# Patient Record
Sex: Male | Born: 1945 | Race: White | Hispanic: No | Marital: Married | Smoking: Never smoker
Health system: Southern US, Community
[De-identification: ages and names within clinical notes are randomized; demographics above are authoritative.]

## PROBLEM LIST (undated history)

## (undated) DIAGNOSIS — S6990XA Unspecified injury of unspecified wrist, hand and finger(s), initial encounter: Secondary | ICD-10-CM

## (undated) DIAGNOSIS — I10 Essential (primary) hypertension: Secondary | ICD-10-CM

## (undated) DIAGNOSIS — E785 Hyperlipidemia, unspecified: Secondary | ICD-10-CM

## (undated) DIAGNOSIS — H269 Unspecified cataract: Secondary | ICD-10-CM

## (undated) DIAGNOSIS — N189 Chronic kidney disease, unspecified: Secondary | ICD-10-CM

## (undated) DIAGNOSIS — E119 Type 2 diabetes mellitus without complications: Secondary | ICD-10-CM

## (undated) HISTORY — DX: Essential (primary) hypertension: I10

## (undated) HISTORY — PX: APPENDECTOMY: SHX54

## (undated) HISTORY — DX: Hyperlipidemia, unspecified: E78.5

## (undated) HISTORY — DX: Chronic kidney disease, unspecified: N18.9

## (undated) HISTORY — PX: CATARACT EXTRACTION: SUR2

## (undated) HISTORY — DX: Type 2 diabetes mellitus without complications: E11.9

## (undated) HISTORY — DX: Unspecified injury of unspecified wrist, hand and finger(s), initial encounter: S69.90XA

## (undated) HISTORY — DX: Unspecified cataract: H26.9

---

## 2003-02-02 ENCOUNTER — Encounter: Admission: RE | Admit: 2003-02-02 | Discharge: 2003-05-03 | Payer: Self-pay | Admitting: Family Medicine

## 2003-05-11 ENCOUNTER — Encounter: Admission: RE | Admit: 2003-05-11 | Discharge: 2003-08-09 | Payer: Self-pay | Admitting: Family Medicine

## 2004-09-11 LAB — HM COLONOSCOPY

## 2007-10-02 HISTORY — PX: COLONOSCOPY: SHX174

## 2008-01-19 LAB — HM COLONOSCOPY

## 2017-01-03 LAB — HM DIABETES EYE EXAM

## 2017-04-15 ENCOUNTER — Ambulatory Visit (INDEPENDENT_AMBULATORY_CARE_PROVIDER_SITE_OTHER): Payer: Medicare Other | Admitting: Adult Health

## 2017-04-15 ENCOUNTER — Encounter: Payer: Self-pay | Admitting: Adult Health

## 2017-04-15 VITALS — BP 150/88 | HR 85 | Ht 70.0 in | Wt 172.0 lb

## 2017-04-15 DIAGNOSIS — I152 Hypertension secondary to endocrine disorders: Secondary | ICD-10-CM | POA: Insufficient documentation

## 2017-04-15 DIAGNOSIS — Z0000 Encounter for general adult medical examination without abnormal findings: Secondary | ICD-10-CM | POA: Diagnosis not present

## 2017-04-15 DIAGNOSIS — R5383 Other fatigue: Secondary | ICD-10-CM | POA: Diagnosis not present

## 2017-04-15 DIAGNOSIS — I10 Essential (primary) hypertension: Secondary | ICD-10-CM | POA: Insufficient documentation

## 2017-04-15 MED ORDER — LISINOPRIL 10 MG PO TABS: 10.0000 mg | ORAL_TABLET | Freq: Every day | ORAL | 3 refills | Status: DC

## 2017-04-15 NOTE — Assessment & Plan Note (Signed)
Reports noticeable increase in fatigue in last 3 months. Vit d and TSH levels checked today.

## 2017-04-15 NOTE — Assessment & Plan Note (Signed)
Fasting labs obtained today. Continue excellent water intake and follow heart healthy diet. Bring BP log to next OV and if BP well controlled will sign off on surgical clearance.

## 2017-04-15 NOTE — Assessment & Plan Note (Signed)
BP at [facility]- 164/101, re-check 178/102 BP at OV today 147/87, re-check 150/82 Started on Lisinopril 10mg  daily Fasting labs checked today. Please check BP daily and bring log in at next OV. Please return in 3-4 weeks for BP check and clearance for surgery.

## 2017-04-15 NOTE — Progress Notes (Signed)
Subjective:    Patient ID: Jason Jason [name], Jason [name]    DOB: 04/17/1946, 71 y.o.   MRN: [id number]  HPI:  Jason Jason [name] presents to establish as a new pt.  He is a very pleasant 71 year old Jason [name].  PMH:  Successful cataract removal OS.  Aborted OD cataract removal due to elevated BP- SBPs 160-150, DBPs 100-110 He denies CP/palpiations/dyspnea/poor healing.  He hasn't has regular medical care in years, however he feel well overall.  His mother passed away this Feb 04, 2023 and his ex-wife June of this year.  He states that he is handling these loss's and has a strong support system of family and friends (he has one grown son that lives on the [place] and he visits often).  He denies tobacco use and reports social EOTH use.   Of Note: He requires medical clearance prior to OD cataract removal-he provided clearance form.  Patient Care Team    Relationship Specialty Notifications Start End  Esaw Grandchild, NP PCP - General Family Medicine  04/15/17     Patient Active Problem List   Diagnosis Date Noted  . HTN (hypertension) 04/15/2017  . Routine health maintenance 04/15/2017  . Fatigue 04/15/2017     Past Medical History:  Diagnosis Date  . Diabetes mellitus without complication ([place])   . Hypertension   . Wrist injury      Past Surgical History:  Procedure Laterality Date  . APPENDECTOMY    . CATARACT EXTRACTION       Family History  Problem Relation Age of Onset  . Stroke Mother   . Arthritis Father      History  Drug Use No     History  Alcohol Use  . 2.4 oz/week  . 4 Cans of beer per week     History  Smoking Status  . Never Smoker  Smokeless Tobacco  . Never Used     Outpatient Encounter Prescriptions as of 04/15/2017  Medication Sig  . lisinopril (PRINIVIL,ZESTRIL) 10 MG tablet Take 1 tablet (10 mg total) by mouth daily.   No facility-administered encounter medications on file as of 04/15/2017.     Allergies: Patient has no known allergies.  Body  mass index is 24.68 kg/m.  Blood pressure (!) 150/88, pulse 85, height 5\' 10"  (1.778 m), weight 172 lb (78 kg).    Review of Systems  Constitutional: Positive for fatigue. Negative for activity change, appetite change, chills, diaphoresis, fever and unexpected weight change.  HENT: Negative for congestion.   Eyes: Negative for visual disturbance.  Respiratory: Negative for cough, chest tightness, shortness of breath, wheezing and stridor.   Cardiovascular: Negative for chest pain, palpitations and leg swelling.  Gastrointestinal: Negative for abdominal distention, abdominal pain, blood in stool, constipation, diarrhea, nausea and vomiting.  Endocrine: Negative for cold intolerance, heat intolerance, polydipsia, polyphagia and polyuria.  Genitourinary: Negative for difficulty urinating, flank pain and hematuria.  Musculoskeletal: Negative for arthralgias, back pain, gait problem, joint swelling, myalgias and neck pain.  Skin: Negative for color change, pallor, rash and wound.  Allergic/Immunologic: Negative for immunocompromised state.  Neurological: Negative for dizziness and headaches.  Hematological: Does not bruise/bleed easily.  Psychiatric/Behavioral: Negative for dysphoric mood and sleep disturbance. The patient is not nervous/anxious.        Objective:   Physical Exam  Constitutional: He is oriented to person, place, and time. He appears well-developed and well-nourished. No distress.  HENT:  Head: Normocephalic and atraumatic.  Right Ear: External ear normal.  Left Ear: External ear normal.  Eyes: Pupils are equal, round, and reactive to light. Conjunctivae are normal.  Neck: Normal range of motion. Neck supple.  Cardiovascular: Normal rate, regular rhythm, normal heart sounds and intact distal pulses.   No murmur heard. Pulmonary/Chest: Effort normal and breath sounds normal. No respiratory distress. He has no wheezes. He has no rales. He exhibits no tenderness.   Musculoskeletal: Normal range of motion.  Lymphadenopathy:    He has no cervical adenopathy.  Neurological: He is alert and oriented to person, place, and time. Coordination normal.  Skin: Skin is warm and dry. No rash noted. He is not diaphoretic. No erythema. No pallor.  Psychiatric: He has a normal mood and affect. His behavior is normal. Judgment and thought content normal.  Nursing note and vitals reviewed.         Assessment & Plan:   1. Routine health maintenance   2. Essential hypertension   3. Fatigue, unspecified type     HTN (hypertension) BP at [facility]- 164/101, re-check 178/102 BP at OV today 147/87, re-check 150/82 Started on Lisinopril 10mg  daily Fasting labs checked today. Please check BP daily and bring log in at next OV. Please return in 3-4 weeks for BP check and clearance for surgery.   Routine health maintenance Fasting labs obtained today. Continue excellent water intake and follow heart healthy diet. Bring BP log to next OV and if BP well controlled will sign off on surgical clearance.   Fatigue Reports noticeable increase in fatigue in last 3 months. Vit d and TSH levels checked today.     FOLLOW-UP:  Return in about 3 weeks (around 05/06/2017) for Regular Follow Up, HTN.

## 2017-04-15 NOTE — Patient Instructions (Signed)
Hypertension Hypertension, commonly called high blood pressure, is when the force of blood pumping through the arteries is too strong. The arteries are the blood vessels that carry blood from the heart throughout the body. Hypertension forces the heart to work harder to pump blood and may cause arteries to become narrow or stiff. Having untreated or uncontrolled hypertension can cause heart attacks, strokes, kidney disease, and other problems. A blood pressure reading consists of a higher number over a lower number. Ideally, your blood pressure should be below 120/80. The first ("top") number is called the systolic pressure. It is a measure of the pressure in your arteries as your heart beats. The second ("bottom") number is called the diastolic pressure. It is a measure of the pressure in your arteries as the heart relaxes. What are the causes? The cause of this condition is not known. What increases the risk? Some risk factors for high blood pressure are under your control. Others are not. Factors you can change  Smoking.  Having type 2 diabetes mellitus, high cholesterol, or both.  Not getting enough exercise or physical activity.  Being overweight.  Having too much fat, sugar, calories, or salt (sodium) in your diet.  Drinking too much alcohol. Factors that are difficult or impossible to change  Having chronic kidney disease.  Having a family history of high blood pressure.  Age. Risk increases with age.  Race. You may be at higher risk if you are African-American.  Gender. Men are at higher risk than women before age 45. After age 65, women are at higher risk than men.  Having obstructive sleep apnea.  Stress. What are the signs or symptoms? Extremely high blood pressure (hypertensive crisis) may cause:  Headache.  Anxiety.  Shortness of breath.  Nosebleed.  Nausea and vomiting.  Severe chest pain.  Jerky movements you cannot control (seizures).  How is this  diagnosed? This condition is diagnosed by measuring your blood pressure while you are seated, with your arm resting on a surface. The cuff of the blood pressure monitor will be placed directly against the skin of your upper arm at the level of your heart. It should be measured at least twice using the same arm. Certain conditions can cause a difference in blood pressure between your right and left arms. Certain factors can cause blood pressure readings to be lower or higher than normal (elevated) for a short period of time:  When your blood pressure is higher when you are in a health care provider's office than when you are at home, this is called white coat hypertension. Most people with this condition do not need medicines.  When your blood pressure is higher at home than when you are in a health care provider's office, this is called masked hypertension. Most people with this condition may need medicines to control blood pressure.  If you have a high blood pressure reading during one visit or you have normal blood pressure with other risk factors:  You may be asked to return on a different day to have your blood pressure checked again.  You may be asked to monitor your blood pressure at home for 1 week or longer.  If you are diagnosed with hypertension, you may have other blood or imaging tests to help your health care provider understand your overall risk for other conditions. How is this treated? This condition is treated by making healthy lifestyle changes, such as eating healthy foods, exercising more, and reducing your alcohol intake. Your   health care provider may prescribe medicine if lifestyle changes are not enough to get your blood pressure under control, and if:  Your systolic blood pressure is above 130.  Your diastolic blood pressure is above 80.  Your personal target blood pressure may vary depending on your medical conditions, your age, and other factors. Follow these  instructions at home: Eating and drinking  Eat a diet that is high in fiber and potassium, and low in sodium, added sugar, and fat. An example eating plan is called the DASH (Dietary Approaches to Stop Hypertension) diet. To eat this way: ? Eat plenty of fresh fruits and vegetables. Try to fill half of your plate at each meal with fruits and vegetables. ? Eat whole grains, such as whole wheat pasta, brown rice, or whole grain bread. Fill about one quarter of your plate with whole grains. ? Eat or drink low-fat dairy products, such as skim milk or low-fat yogurt. ? Avoid fatty cuts of meat, processed or cured meats, and poultry with skin. Fill about one quarter of your plate with lean proteins, such as fish, chicken without skin, beans, eggs, and tofu. ? Avoid premade and processed foods. These tend to be higher in sodium, added sugar, and fat.  Reduce your daily sodium intake. Most people with hypertension should eat less than 1,500 mg of sodium a day.  Limit alcohol intake to no more than 1 drink a day for nonpregnant women and 2 drinks a day for men. One drink equals 12 oz of beer, 5 oz of wine, or 1 oz of hard liquor. Lifestyle  Work with your health care provider to maintain a healthy body weight or to lose weight. Ask what an ideal weight is for you.  Get at least 30 minutes of exercise that causes your heart to beat faster (aerobic exercise) most days of the week. Activities may include walking, swimming, or biking.  Include exercise to strengthen your muscles (resistance exercise), such as pilates or lifting weights, as part of your weekly exercise routine. Try to do these types of exercises for 30 minutes at least 3 days a week.  Do not use any products that contain nicotine or tobacco, such as cigarettes and e-cigarettes. If you need help quitting, ask your health care provider.  Monitor your blood pressure at home as told by your health care provider.  Keep all follow-up visits as  told by your health care provider. This is important. Medicines  Take over-the-counter and prescription medicines only as told by your health care provider. Follow directions carefully. Blood pressure medicines must be taken as prescribed.  Do not skip doses of blood pressure medicine. Doing this puts you at risk for problems and can make the medicine less effective.  Ask your health care provider about side effects or reactions to medicines that you should watch for. Contact a health care provider if:  You think you are having a reaction to a medicine you are taking.  You have headaches that keep coming back (recurring).  You feel dizzy.  You have swelling in your ankles.  You have trouble with your vision. Get help right away if:  You develop a severe headache or confusion.  You have unusual weakness or numbness.  You feel faint.  You have severe pain in your chest or abdomen.  You vomit repeatedly.  You have trouble breathing. Summary  Hypertension is when the force of blood pumping through your arteries is too strong. If this condition is not   controlled, it may put you at risk for serious complications.  Your personal target blood pressure may vary depending on your medical conditions, your age, and other factors. For most people, a normal blood pressure is less than 120/80.  Hypertension is treated with lifestyle changes, medicines, or a combination of both. Lifestyle changes include weight loss, eating a healthy, low-sodium diet, exercising more, and limiting alcohol. This information is not intended to replace advice given to you by your health care provider. Make sure you discuss any questions you have with your health care provider. Document Released: 09/17/2005 Document Revised: 08/15/2016 Document Reviewed: 08/15/2016 Elsevier Interactive Patient Education  2018 [employer].   Lisinopril tablets What is this medicine? LISINOPRIL (lyse [place] pril) is an ACE  inhibitor. This medicine is used to treat high blood pressure and heart failure. It is also used to protect the heart immediately after a heart attack. This medicine may be used for other purposes; ask your health care provider or pharmacist if you have questions. COMMON BRAND NAME(S): Prinivil, Zestril What should I tell my health care provider before I take this medicine? They need to know if you have any of these conditions: -diabetes -heart or blood vessel disease -kidney disease -low blood pressure -previous swelling of the tongue, face, or lips with difficulty breathing, difficulty swallowing, hoarseness, or tightening of the throat -an unusual or allergic reaction to lisinopril, other ACE inhibitors, insect venom, foods, dyes, or preservatives -pregnant or trying to get pregnant -breast-feeding How should I use this medicine? Take this medicine by mouth with a glass of water. Follow the directions on your prescription label. You may take this medicine with or without food. If it upsets your stomach, take it with food. Take your medicine at regular intervals. Do not take it more often than directed. Do not stop taking except on your doctor's advice. Talk to your pediatrician regarding the use of this medicine in children. Special care may be needed. While this drug may be prescribed for children as young as 68 years of age for selected conditions, precautions do apply. Overdosage: If you think you have taken too much of this medicine contact a poison control center or emergency room at once. NOTE: This medicine is only for you. Do not share this medicine with others. What if I miss a dose? If you miss a dose, take it as soon as you can. If it is almost time for your next dose, take only that dose. Do not take double or extra doses. What may interact with this medicine? Do not take this medicine with any of the following medications: -hymenoptera venom -sacubitril; valsartan This  medicines may also interact with the following medications: -aliskiren -angiotensin receptor blockers, like losartan or valsartan -certain medicines for diabetes -diuretics -everolimus -gold compounds -lithium -NSAIDs, medicines for pain and inflammation, like ibuprofen or naproxen -potassium salts or supplements -salt substitutes -sirolimus -temsirolimus This list may not describe all possible interactions. Give your health care provider a list of all the medicines, herbs, non-prescription drugs, or dietary supplements you use. Also tell them if you smoke, drink alcohol, or use illegal drugs. Some items may interact with your medicine. What should I watch for while using this medicine? Visit your doctor or health care professional for regular check ups. Check your blood pressure as directed. Ask your doctor what your blood pressure should be, and when you should contact him or her. Do not treat yourself for coughs, colds, or pain while you are  using this medicine without asking your doctor or health care professional for advice. Some ingredients may increase your blood pressure. Women should inform their doctor if they wish to become pregnant or think they might be pregnant. There is a potential for serious side effects to an unborn child. Talk to your health care professional or pharmacist for more information. Check with your doctor or health care professional if you get an attack of severe diarrhea, nausea and vomiting, or if you sweat a lot. The loss of too much body fluid can make it dangerous for you to take this medicine. You may get drowsy or dizzy. Do not drive, use machinery, or do anything that needs mental alertness until you know how this drug affects you. Do not stand or sit up quickly, especially if you are an older patient. This reduces the risk of dizzy or fainting spells. Alcohol can make you more drowsy and dizzy. Avoid alcoholic drinks. Avoid salt substitutes unless you are  told otherwise by your doctor or health care professional. What side effects may I notice from receiving this medicine? Side effects that you should report to your doctor or health care professional as soon as possible: -allergic reactions like skin rash, itching or hives, swelling of the hands, feet, face, lips, throat, or tongue -breathing problems -signs and symptoms of kidney injury like trouble passing urine or change in the amount of urine -signs and symptoms of increased potassium like muscle weakness; chest pain; or fast, irregular heartbeat -signs and symptoms of liver injury like dark yellow or brown urine; general ill feeling or flu-like symptoms; light-colored stools; loss of appetite; nausea; right upper belly pain; unusually weak or tired; yellowing of the eyes or skin -signs and symptoms of low blood pressure like dizziness; feeling faint or lightheaded, falls; unusually weak or tired -stomach pain with or without nausea and vomiting Side effects that usually do not require medical attention (report to your doctor or health care professional if they continue or are bothersome): -changes in taste -cough -dizziness -fever -headache -sensitivity to light This list may not describe all possible side effects. Call your doctor for medical advice about side effects. You may report side effects to FDA at 1-800-FDA-1088. Where should I keep my medicine? Keep out of the reach of children. Store at room temperature between 15 and 30 degrees C (59 and 86 degrees F). Protect from moisture. Keep container tightly closed. Throw away any unused medicine after the expiration date. NOTE: This sheet is a summary. It may not cover all possible information. If you have questions about this medicine, talk to your doctor, pharmacist, or health care provider.  2018 Elsevier/Gold Standard (2015-11-07 12:52:35)  Heart-Healthy Eating Plan Many factors influence your heart health, including eating and  exercise habits. Heart (coronary) risk increases with abnormal blood fat (lipid) levels. Heart-healthy meal planning includes limiting unhealthy fats, increasing healthy fats, and making other small dietary changes. This includes maintaining a healthy body weight to help keep lipid levels within a normal range. What is my plan? Your health care provider recommends that you:  Get no more than _________% of the total calories in your daily diet from fat.  Limit your intake of saturated fat to less than _________% of your total calories each day.  Limit the amount of cholesterol in your diet to less than _________ mg per day.  What types of fat should I choose?  Choose healthy fats more often. Choose monounsaturated and polyunsaturated fats, such as olive  oil and canola oil, flaxseeds, walnuts, almonds, and seeds.  Eat more omega-3 fats. Good choices include salmon, mackerel, sardines, tuna, flaxseed oil, and ground flaxseeds. Aim to eat fish at least two times each week.  Limit saturated fats. Saturated fats are primarily found in animal products, such as meats, butter, and cream. Plant sources of saturated fats include palm oil, palm kernel oil, and coconut oil.  Avoid foods with partially hydrogenated oils in them. These contain trans fats. Examples of foods that contain trans fats are stick margarine, some tub margarines, cookies, crackers, and other baked goods. What general guidelines do I need to follow?  Check food labels carefully to identify foods with trans fats or high amounts of saturated fat.  Fill one half of your plate with vegetables and green salads. Eat 4-5 servings of vegetables per day. A serving of vegetables equals 1 cup of raw leafy vegetables,  cup of raw or cooked cut-up vegetables, or  cup of vegetable juice.  Fill one fourth of your plate with whole grains. Look for the word "whole" as the first word in the ingredient list.  Fill one fourth of your plate with  lean protein foods.  Eat 4-5 servings of fruit per day. A serving of fruit equals one medium whole fruit,  cup of dried fruit,  cup of fresh, frozen, or canned fruit, or  cup of 100% fruit juice.  Eat more foods that contain soluble fiber. Examples of foods that contain this type of fiber are apples, broccoli, carrots, beans, peas, and barley. Aim to get 20-30 g of fiber per day.  Eat more home-cooked food and less restaurant, buffet, and fast food.  Limit or avoid alcohol.  Limit foods that are high in starch and sugar.  Avoid fried foods.  Cook foods by using methods other than frying. Baking, boiling, grilling, and broiling are all great options. Other fat-reducing suggestions include: ? Removing the skin from poultry. ? Removing all visible fats from meats. ? Skimming the fat off of stews, soups, and gravies before serving them. ? Steaming vegetables in water or broth.  Lose weight if you are overweight. Losing just 5-10% of your initial body weight can help your overall health and prevent diseases such as diabetes and heart disease.  Increase your consumption of nuts, legumes, and seeds to 4-5 servings per week. One serving of dried beans or legumes equals  cup after being cooked, one serving of nuts equals 1 ounces, and one serving of seeds equals  ounce or 1 tablespoon.  You may need to monitor your salt (sodium) intake, especially if you have high blood pressure. Talk with your health care provider or dietitian to get more information about reducing sodium. What foods can I eat? Grains  Breads, including [place], white, pita, wheat, raisin, rye, oatmeal, and [place]. Tortillas that are neither fried nor made with lard or trans fat. Low-fat rolls, including hotdog and hamburger buns and English muffins. Biscuits. Muffins. Waffles. Pancakes. Light popcorn. Whole-grain cereals. Flatbread. Melba toast. Pretzels. Breadsticks. Rusks. Low-fat snacks and crackers, including oyster,  saltine, matzo, graham, animal, and rye. Rice and pasta, including brown rice and those that are made with whole wheat. Vegetables All vegetables. Fruits All fruits, but limit coconut. Meats and Other Protein Sources Lean, well-trimmed beef, veal, pork, and lamb. Chicken and [place] without skin. All fish and shellfish. Wild duck, rabbit, pheasant, and venison. Egg whites or low-cholesterol egg substitutes. Dried beans, peas, lentils, and tofu.Seeds and most nuts. Dairy  Low-fat or nonfat cheeses, including ricotta, string, and mozzarella. Skim or 1% milk that is liquid, powdered, or evaporated. Buttermilk that is made with low-fat milk. Nonfat or low-fat yogurt. Beverages Mineral water. Diet carbonated beverages. Sweets and Desserts Sherbets and fruit ices. Honey, jam, marmalade, jelly, and syrups. Meringues and gelatins. Pure sugar candy, such as hard candy, jelly beans, gumdrops, mints, marshmallows, and small amounts of dark chocolate. [employer] Eat all sweets and desserts in moderation. Fats and Oils Nonhydrogenated (trans-free) margarines. Vegetable oils, including soybean, sesame, sunflower, olive, peanut, safflower, corn, canola, and cottonseed. Salad dressings or mayonnaise that are made with a vegetable oil. Limit added fats and oils that you use for cooking, baking, salads, and as spreads. Other Cocoa powder. Coffee and tea. All seasonings and condiments. The items listed above may not be a complete list of recommended foods or beverages. Contact your dietitian for more options. What foods are not recommended? Grains Breads that are made with saturated or trans fats, oils, or whole milk. Croissants. Butter rolls. Cheese breads. Sweet rolls. Donuts. Buttered popcorn. Chow mein noodles. High-fat crackers, such as cheese or butter crackers. Meats and Other Protein Sources Fatty meats, such as hotdogs, short ribs, sausage, spareribs, bacon, ribeye roast or steak, and mutton.  High-fat deli meats, such as salami and bologna. Caviar. Domestic duck and goose. Organ meats, such as kidney, liver, sweetbreads, brains, gizzard, chitterlings, and heart. Dairy Cream, sour cream, cream cheese, and creamed cottage cheese. Whole milk cheeses, including blue (bleu), Monte[place] Jack, [place], [place], [place], [place], [place], [place], [place], and [place]. Whole or 2% milk that is liquid, evaporated, or condensed. Whole buttermilk. Cream sauce or high-fat cheese sauce. Yogurt that is made from whole milk. Beverages Regular sodas and drinks with added sugar. Sweets and Desserts Frosting. Pudding. Cookies. Cakes other than angel food cake. Candy that has milk chocolate or white chocolate, hydrogenated fat, butter, coconut, or unknown ingredients. Buttered syrups. Full-fat ice cream or ice cream drinks. Fats and Oils Gravy that has suet, meat fat, or shortening. Cocoa butter, hydrogenated oils, palm oil, coconut oil, palm kernel oil. These can often be found in baked products, candy, fried foods, nondairy creamers, and whipped toppings. Solid fats and shortenings, including bacon fat, salt pork, lard, and butter. Nondairy cream substitutes, such as coffee creamers and sour cream substitutes. Salad dressings that are made of unknown oils, cheese, or sour cream. The items listed above may not be a complete list of foods and beverages to avoid. Contact your dietitian for more information. This information is not intended to replace advice given to you by your health care provider. Make sure you discuss any questions you have with your health care provider. Document Released: 06/26/2008 Document Revised: 04/06/2016 Document Reviewed: 03/11/2014 Elsevier Interactive Patient Education  2017 [place].   Please check BP as often as you can and bring log with to next office visit in 3-4 weeks. Continue excellent water intake and follow heart healthy diet. We will call when lab results are  available. WELCOME TO THE PRACTICE!

## 2017-04-17 LAB — CBC WITH DIFFERENTIAL/PLATELET
BASOS ABS: 0 10*3/uL (ref 0.0–0.2)
Basos: 0 %
EOS (ABSOLUTE): 0.1 10*3/uL (ref 0.0–0.4)
Eos: 2 %
HEMATOCRIT: 47.5 % (ref 37.5–51.0)
HEMOGLOBIN: 16.7 g/dL (ref 13.0–17.7)
IMMATURE GRANULOCYTES: 0 %
Immature Grans (Abs): 0 10*3/uL (ref 0.0–0.1)
LYMPHS ABS: 1.7 10*3/uL (ref 0.7–3.1)
Lymphs: 21 %
MCH: 36.1 pg — ABNORMAL HIGH (ref 26.6–33.0)
MCHC: 35.2 g/dL (ref 31.5–35.7)
MCV: 103 fL — ABNORMAL HIGH (ref 79–97)
MONOCYTES: 9 %
Monocytes Absolute: 0.7 10*3/uL (ref 0.1–0.9)
NEUTROS PCT: 68 %
Neutrophils Absolute: 5.4 10*3/uL (ref 1.4–7.0)
Platelets: 216 10*3/uL (ref 150–379)
RBC: 4.63 x10E6/uL (ref 4.14–5.80)
RDW: 12.8 % (ref 12.3–15.4)
WBC: 7.9 10*3/uL (ref 3.4–10.8)

## 2017-04-17 LAB — COMPREHENSIVE METABOLIC PANEL
ALK PHOS: 58 IU/L (ref 39–117)
ALT: 46 IU/L — AB (ref 0–44)
AST: 37 IU/L (ref 0–40)
Albumin/Globulin Ratio: 1.7 (ref 1.2–2.2)
Albumin: 4.3 g/dL (ref 3.6–4.8)
BILIRUBIN TOTAL: 0.9 mg/dL (ref 0.0–1.2)
BUN/Creatinine Ratio: 15 (ref 10–24)
BUN: 11 mg/dL (ref 8–27)
CHLORIDE: 100 mmol/L (ref 96–106)
CO2: 21 mmol/L (ref 20–29)
CREATININE: 0.75 mg/dL — AB (ref 0.76–1.27)
Calcium: 9.9 mg/dL (ref 8.6–10.2)
GFR calc Af Amer: 109 mL/min/{1.73_m2} (ref >59)
GFR calc non Af Amer: 94 mL/min/{1.73_m2} (ref >59)
GLUCOSE: 164 mg/dL — AB (ref 65–99)
Globulin, Total: 2.5 g/dL (ref 1.5–4.5)
Potassium: 4.9 mmol/L (ref 3.5–5.2)
Sodium: 140 mmol/L (ref 134–144)
Total Protein: 6.8 g/dL (ref 6.0–8.5)

## 2017-04-17 LAB — HEMOGLOBIN A1C
Est. average glucose Bld gHb Est-mCnc: 160 mg/dL
Hgb A1c MFr Bld: 7.2 % — ABNORMAL HIGH (ref 4.8–5.6)

## 2017-04-17 LAB — LIPID PANEL
CHOLESTEROL TOTAL: 209 mg/dL — AB (ref 100–199)
Chol/HDL Ratio: 4.6 ratio (ref 0.0–5.0)
HDL: 45 mg/dL (ref >39)
LDL CALC: 145 mg/dL — AB (ref 0–99)
TRIGLYCERIDES: 95 mg/dL (ref 0–149)
VLDL Cholesterol Cal: 19 mg/dL (ref 5–40)

## 2017-04-17 LAB — VITAMIN D 25 HYDROXY (VIT D DEFICIENCY, FRACTURES): Vit D, 25-Hydroxy: 40.6 ng/mL (ref 30.0–100.0)

## 2017-04-17 LAB — VITAMIN B12: VITAMIN B 12: 986 pg/mL (ref 232–1245)

## 2017-05-16 ENCOUNTER — Encounter: Payer: Self-pay | Admitting: Adult Health

## 2017-05-16 ENCOUNTER — Ambulatory Visit (INDEPENDENT_AMBULATORY_CARE_PROVIDER_SITE_OTHER): Payer: Medicare Other | Admitting: Adult Health

## 2017-05-16 VITALS — BP 179/92 | HR 83 | Ht 70.0 in | Wt 173.4 lb

## 2017-05-16 DIAGNOSIS — E1169 Type 2 diabetes mellitus with other specified complication: Secondary | ICD-10-CM | POA: Diagnosis not present

## 2017-05-16 DIAGNOSIS — I10 Essential (primary) hypertension: Secondary | ICD-10-CM | POA: Diagnosis not present

## 2017-05-16 DIAGNOSIS — E119 Type 2 diabetes mellitus without complications: Secondary | ICD-10-CM | POA: Insufficient documentation

## 2017-05-16 DIAGNOSIS — E785 Hyperlipidemia, unspecified: Secondary | ICD-10-CM | POA: Diagnosis not present

## 2017-05-16 DIAGNOSIS — Z79899 Other long term (current) drug therapy: Secondary | ICD-10-CM | POA: Diagnosis not present

## 2017-05-16 DIAGNOSIS — E118 Type 2 diabetes mellitus with unspecified complications: Secondary | ICD-10-CM | POA: Diagnosis not present

## 2017-05-16 MED ORDER — GLUCOSE BLOOD VI STRP: ORAL_STRIP | 12 refills | Status: DC

## 2017-05-16 MED ORDER — BLOOD GLUCOSE METER KIT: PACK | 0 refills | Status: AC

## 2017-05-16 MED ORDER — ATORVASTATIN CALCIUM 10 MG PO TABS: 10.0000 mg | ORAL_TABLET | Freq: Every day | ORAL | 3 refills | Status: DC

## 2017-05-16 MED ORDER — METFORMIN HCL 500 MG PO TABS: 500.0000 mg | ORAL_TABLET | Freq: Two times a day (BID) | ORAL | 3 refills | Status: DC

## 2017-05-16 MED ORDER — LISINOPRIL-HYDROCHLOROTHIAZIDE 10-12.5 MG PO TABS: 1.0000 | ORAL_TABLET | Freq: Every day | ORAL | 3 refills | Status: DC

## 2017-05-16 NOTE — Assessment & Plan Note (Signed)
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 Reduce CHO/sugar and abstain from [facility] Continue daily walking

## 2017-05-16 NOTE — Patient Instructions (Addendum)
Carbohydrate Counting for Diabetes Mellitus, Adult Carbohydrate counting is a method for keeping track of how many carbohydrates you eat. Eating carbohydrates naturally increases the amount of sugar (glucose) in the blood. Counting how many carbohydrates you eat helps keep your blood glucose within normal limits, which helps you manage your diabetes (diabetes mellitus). It is important to know how many carbohydrates you can safely have in each meal. This is different for every person. A diet and nutrition specialist (registered dietitian) can help you make a meal plan and calculate how many carbohydrates you should have at each meal and snack. Carbohydrates are found in the following foods:  Grains, such as breads and cereals.  Dried beans and soy products.  Starchy vegetables, such as potatoes, peas, and corn.  Fruit and fruit juices.  Milk and yogurt.  Sweets and snack foods, such as cake, cookies, candy, chips, and soft drinks.  How do I count carbohydrates? There are two ways to count carbohydrates in food. You can use either of the methods or a combination of both. Reading "Nutrition Facts" on packaged food The "Nutrition Facts" list is included on the labels of almost all packaged foods and beverages in the [place] It includes:  The serving size.  Information about nutrients in each serving, including the grams (g) of carbohydrate per serving.  To use the "Nutrition Facts":  Decide how many servings you will have.  Multiply the number of servings by the number of carbohydrates per serving.  The resulting number is the total amount of carbohydrates that you will be having.  Learning standard serving sizes of other foods When you eat foods containing carbohydrates that are not packaged or do not include "Nutrition Facts" on the label, you need to measure the servings in order to count the amount of carbohydrates:  Measure the foods that you will eat with a food scale or  measuring cup, if needed.  Decide how many standard-size servings you will eat.  Multiply the number of servings by 15. Most carbohydrate-rich foods have about 15 g of carbohydrates per serving. ? For example, if you eat 8 oz (170 g) of strawberries, you will have eaten 2 servings and 30 g of carbohydrates (2 servings x 15 g = 30 g).  For foods that have more than one food mixed, such as soups and casseroles, you must count the carbohydrates in each food that is included.  The following list contains standard serving sizes of common carbohydrate-rich foods. Each of these servings has about 15 g of carbohydrates:   hamburger bun or  English muffin.   oz (15 mL) syrup.   oz (14 g) jelly.  1 slice of bread.  1 six-inch tortilla.  3 oz (85 g) cooked rice or pasta.  4 oz (113 g) cooked dried beans.  4 oz (113 g) starchy vegetable, such as peas, corn, or potatoes.  4 oz (113 g) hot cereal.  4 oz (113 g) mashed potatoes or  of a large baked potato.  4 oz (113 g) canned or frozen fruit.  4 oz (120 mL) fruit juice.  4-6 crackers.  6 chicken nuggets.  6 oz (170 g) unsweetened dry cereal.  6 oz (170 g) plain fat-free yogurt or yogurt sweetened with artificial sweeteners.  8 oz (240 mL) milk.  8 oz (170 g) fresh fruit or one small piece of fruit.  24 oz (680 g) popped popcorn.  Example of carbohydrate counting Sample meal  3 oz (85 g) chicken breast.    6 oz (170 g) brown rice.  4 oz (113 g) corn.  8 oz (240 mL) milk.  8 oz (170 g) strawberries with sugar-free whipped topping. Carbohydrate calculation 1. Identify the foods that contain carbohydrates: ? Rice. ? Corn. ? Milk. ? Strawberries. 2. Calculate how many servings you have of each food: ? 2 servings rice. ? 1 serving corn. ? 1 serving milk. ? 1 serving strawberries. 3. Multiply each number of servings by 15 g: ? 2 servings rice x 15 g = 30 g. ? 1 serving corn x 15 g = 15 g. ? 1 serving milk x 15  g = 15 g. ? 1 serving strawberries x 15 g = 15 g. 4. Add together all of the amounts to find the total grams of carbohydrates eaten: ? 30 g + 15 g + 15 g + 15 g = 75 g of carbohydrates total. This information is not intended to replace advice given to you by your health care provider. Make sure you discuss any questions you have with your health care provider. Document Released: 09/17/2005 Document Revised: 04/06/2016 Document Reviewed: 02/29/2016 Elsevier Interactive Patient Education  2018 [employer]. Blood Glucose Monitoring, Adult Monitoring your blood sugar (glucose) helps you manage your diabetes. It also helps you and your health care provider determine how well your diabetes management plan is working. Blood glucose monitoring involves checking your blood glucose as often as directed, and keeping a record (log) of your results over time. Why should I monitor my blood glucose? Checking your blood glucose regularly can:  Help you understand how food, exercise, illnesses, and medicines affect your blood glucose.  Let you know what your blood glucose is at any time. You can quickly tell if you are having low blood glucose (hypoglycemia) or high blood glucose (hyperglycemia).  Help you and your health care provider adjust your medicines as needed.  When should I check my blood glucose? Follow instructions from your health care provider about how often to check your blood glucose. This may depend on:  The type of diabetes you have.  How well-controlled your diabetes is.  Medicines you are taking.  If you have type 1 diabetes:  Check your blood glucose at least 2 times a day.  Also check your blood glucose: ? Before every insulin injection. ? Before and after exercise. ? Between meals. ? 2 hours after a meal. ? Occasionally between 2:00 a.m. and 3:00 a.m., as directed. ? Before potentially dangerous tasks, like driving or using heavy machinery. ? At bedtime.  You may need  to check your blood glucose more often, up to 6-10 times a day: ? If you use an insulin pump. ? If you need multiple daily injections (MDI). ? If your diabetes is not well-controlled. ? If you are ill. ? If you have a history of severe hypoglycemia. ? If you have a history of not knowing when your blood glucose is getting low (hypoglycemia unawareness). If you have type 2 diabetes:  If you take insulin or other diabetes medicines, check your blood glucose at least 2 times a day.  If you are on intensive insulin therapy, check your blood glucose at least 4 times a day. Occasionally, you may also need to check between 2:00 a.m. and 3:00 a.m., as directed.  Also check your blood glucose: ? Before and after exercise. ? Before potentially dangerous tasks, like driving or using heavy machinery.  You may need to check your blood glucose more often if: ?  Your medicine is being adjusted. ? Your diabetes is not well-controlled. ? You are ill. What is a blood glucose log?  A blood glucose log is a record of your blood glucose readings. It helps you and your health care provider: ? Look for patterns in your blood glucose over time. ? Adjust your diabetes management plan as needed.  Every time you check your blood glucose, write down your result and notes about things that may be affecting your blood glucose, such as your diet and exercise for the day.  Most glucose meters store a record of glucose readings in the meter. Some meters allow you to download your records to a computer. How do I check my blood glucose? Follow these steps to get accurate readings of your blood glucose: Supplies needed   Blood glucose meter.  Test strips for your meter. Each meter has its own strips. You must use the strips that come with your meter.  A needle to prick your finger (lancet). Do not use lancets more than once.  A device that holds the lancet (lancing device).  A journal or log book to write down  your results. Procedure  Wash your hands with soap and water.  Prick the side of your finger (not the tip) with the lancet. Use a different finger each time.  Gently rub the finger until a small drop of blood appears.  Follow instructions that come with your meter for inserting the test strip, applying blood to the strip, and using your blood glucose meter.  Write down your result and any notes. Alternative testing sites  Some meters allow you to use areas of your body other than your finger (alternative sites) to test your blood.  If you think you may have hypoglycemia, or if you have hypoglycemia unawareness, do not use alternative sites. Use your finger instead.  Alternative sites may not be as accurate as the fingers, because blood flow is slower in these areas. This means that the result you get may be delayed, and it may be different from the result that you would get from your finger.  The most common alternative sites are: ? Forearm. ? Thigh. ? Palm of the hand. Additional tips  Always keep your supplies with you.  If you have questions or need help, all blood glucose meters have a 24-hour "hotline" number that you can call. You may also contact your health care provider.  After you use a few boxes of test strips, adjust (calibrate) your blood glucose meter by following instructions that came with your meter. This information is not intended to replace advice given to you by your health care provider. Make sure you discuss any questions you have with your health care provider. Document Released: 09/20/2003 Document Revised: 04/06/2016 Document Reviewed: 02/27/2016 Elsevier Interactive Patient Education  2017 [employer]   Hypertension Hypertension, commonly called high blood pressure, is when the force of blood pumping through the arteries is too strong. The arteries are the blood vessels that carry blood from the heart throughout the body. Hypertension forces the  heart to work harder to pump blood and may cause arteries to become narrow or stiff. Having untreated or uncontrolled hypertension can cause heart attacks, strokes, kidney disease, and other problems. A blood pressure reading consists of a higher number over a lower number. Ideally, your blood pressure should be below 120/80. The first ("top") number is called the systolic pressure. It is a measure of the pressure in your arteries as your  heart beats. The second ("bottom") number is called the diastolic pressure. It is a measure of the pressure in your arteries as the heart relaxes. What are the causes? The cause of this condition is not known. What increases the risk? Some risk factors for high blood pressure are under your control. Others are not. Factors you can change  Smoking.  Having type 2 diabetes mellitus, high cholesterol, or both.  Not getting enough exercise or physical activity.  Being overweight.  Having too much fat, sugar, calories, or salt (sodium) in your diet.  Drinking too much alcohol. Factors that are difficult or impossible to change  Having chronic kidney disease.  Having a family history of high blood pressure.  Age. Risk increases with age.  Race. You may be at higher risk if you are African-American.  Gender. Men are at higher risk than women before age 58. After age 45, women are at higher risk than men.  Having obstructive sleep apnea.  Stress. What are the signs or symptoms? Extremely high blood pressure (hypertensive crisis) may cause:  Headache.  Anxiety.  Shortness of breath.  Nosebleed.  Nausea and vomiting.  Severe chest pain.  Jerky movements you cannot control (seizures).  How is this diagnosed? This condition is diagnosed by measuring your blood pressure while you are seated, with your arm resting on a surface. The cuff of the blood pressure monitor will be placed directly against the skin of your upper arm at the level of your  heart. It should be measured at least twice using the same arm. Certain conditions can cause a difference in blood pressure between your right and left arms. Certain factors can cause blood pressure readings to be lower or higher than normal (elevated) for a short period of time:  When your blood pressure is higher when you are in a health care provider's office than when you are at home, this is called white coat hypertension. Most people with this condition do not need medicines.  When your blood pressure is higher at home than when you are in a health care provider's office, this is called masked hypertension. Most people with this condition may need medicines to control blood pressure.  If you have a high blood pressure reading during one visit or you have normal blood pressure with other risk factors:  You may be asked to return on a different day to have your blood pressure checked again.  You may be asked to monitor your blood pressure at home for 1 week or longer.  If you are diagnosed with hypertension, you may have other blood or imaging tests to help your health care provider understand your overall risk for other conditions. How is this treated? This condition is treated by making healthy lifestyle changes, such as eating healthy foods, exercising more, and reducing your alcohol intake. Your health care provider may prescribe medicine if lifestyle changes are not enough to get your blood pressure under control, and if:  Your systolic blood pressure is above 130.  Your diastolic blood pressure is above 80.  Your personal target blood pressure may vary depending on your medical conditions, your age, and other factors. Follow these instructions at home: Eating and drinking  Eat a diet that is high in fiber and potassium, and low in sodium, added sugar, and fat. An example eating plan is called the DASH (Dietary Approaches to Stop Hypertension) diet. To eat this way: ? Eat plenty of  fresh fruits and vegetables. Try to  fill half of your plate at each meal with fruits and vegetables. ? Eat whole grains, such as whole wheat pasta, brown rice, or whole grain bread. Fill about one quarter of your plate with whole grains. ? Eat or drink low-fat dairy products, such as skim milk or low-fat yogurt. ? Avoid fatty cuts of meat, processed or cured meats, and poultry with skin. Fill about one quarter of your plate with lean proteins, such as fish, chicken without skin, beans, eggs, and tofu. ? Avoid premade and processed foods. These tend to be higher in sodium, added sugar, and fat.  Reduce your daily sodium intake. Most people with hypertension should eat less than 1,500 mg of sodium a day.  Limit alcohol intake to no more than 1 drink a day for nonpregnant women and 2 drinks a day for men. One drink equals 12 oz of beer, 5 oz of wine, or 1 oz of hard liquor. Lifestyle  Work with your health care provider to maintain a healthy body weight or to lose weight. Ask what an ideal weight is for you.  Get at least 30 minutes of exercise that causes your heart to beat faster (aerobic exercise) most days of the week. Activities may include walking, swimming, or biking.  Include exercise to strengthen your muscles (resistance exercise), such as pilates or lifting weights, as part of your weekly exercise routine. Try to do these types of exercises for 30 minutes at least 3 days a week.  Do not use any products that contain nicotine or tobacco, such as cigarettes and e-cigarettes. If you need help quitting, ask your health care provider.  Monitor your blood pressure at home as told by your health care provider.  Keep all follow-up visits as told by your health care provider. This is important. Medicines  Take over-the-counter and prescription medicines only as told by your health care provider. Follow directions carefully. Blood pressure medicines must be taken as prescribed.  Do not skip  doses of blood pressure medicine. Doing this puts you at risk for problems and can make the medicine less effective.  Ask your health care provider about side effects or reactions to medicines that you should watch for. Contact a health care provider if:  You think you are having a reaction to a medicine you are taking.  You have headaches that keep coming back (recurring).  You feel dizzy.  You have swelling in your ankles.  You have trouble with your vision. Get help right away if:  You develop a severe headache or confusion.  You have unusual weakness or numbness.  You feel faint.  You have severe pain in your chest or abdomen.  You vomit repeatedly.  You have trouble breathing. Summary  Hypertension is when the force of blood pumping through your arteries is too strong. If this condition is not controlled, it may put you at risk for serious complications.  Your personal target blood pressure may vary depending on your medical conditions, your age, and other factors. For most people, a normal blood pressure is less than 120/80.  Hypertension is treated with lifestyle changes, medicines, or a combination of both. Lifestyle changes include weight loss, eating a healthy, low-sodium diet, exercising more, and limiting alcohol. This information is not intended to replace advice given to you by your health care provider. Make sure you discuss any questions you have with your health care provider. Document Released: 09/17/2005 Document Revised: 08/15/2016 Document Reviewed: 08/15/2016 Elsevier Interactive Patient Education  2018  [employer].  Please take new BP medication daily Please take Atorvastatin 10mg  nightly at 6pm Please take Metformin 500mg  nightly with dinner for the first two weeks, then increase to one tab with breakfast and one tab with dinner. Please call insurance and ask which glucometer your insurance will cover and then call us so we can send in the correct  order. Please make follow-up in 4 weeks. Please bring log of blood pressure and blood sugar at next office appt.

## 2017-05-16 NOTE — Addendum Note (Signed)
Addended by: Fonnie Mu on: 05/16/2017 02:22 PM   Modules accepted: Orders

## 2017-05-16 NOTE — Progress Notes (Signed)
Subjective:    Patient ID: Jason [name], male    DOB: Oct 14, 1945, 71 y.o.   MRN: [id number]  HPI:  Mr. Digilio presents for f/u:  HTN.  He was scheduled for cataract surgery, however sig elevated BP aborted procedure. He established care with our practice on 04/15/17- started on Lisinopril 10mg .  He reports taking medication daily and avoiding Na+.  He denies tobacco use. He reports drinking EOTH and is a bit vague about type/amount.  He estimates to drink 2-3 beers or liquor sat/sun and says "I drink a few during the week too".  He denies [place] abuse. After lengthy discussion, he revealed that he has been dx'd with T2D and HL.  He was on "some sort of medication for diabetes years ago". He continues to walk daily and denies CP/dyspnea/palpations.     Patient Care Team    Relationship Specialty Notifications Start End  Esaw Grandchild, NP PCP - General Family Medicine  04/15/17     Patient Active Problem List   Diagnosis Date Noted  . HTN (hypertension) 04/15/2017  . Routine health maintenance 04/15/2017  . Fatigue 04/15/2017     Past Medical History:  Diagnosis Date  . Diabetes mellitus without complication ([place])   . Hypertension   . Wrist injury      Past Surgical History:  Procedure Laterality Date  . APPENDECTOMY    . CATARACT EXTRACTION       Family History  Problem Relation Age of Onset  . Stroke Mother   . Arthritis Father      History  Drug Use No     History  Alcohol Use  . 2.4 oz/week  . 4 Cans of beer per week     History  Smoking Status  . Never Smoker  Smokeless Tobacco  . Never Used     Outpatient Encounter Prescriptions as of 05/16/2017  Medication Sig  . [DISCONTINUED] lisinopril (PRINIVIL,ZESTRIL) 10 MG tablet Take 1 tablet (10 mg total) by mouth daily.  Marland Kitchen atorvastatin (LIPITOR) 10 MG tablet Take 1 tablet (10 mg total) by mouth daily.  Marland Kitchen lisinopril-hydrochlorothiazide (PRINZIDE,ZESTORETIC) 10-12.5 MG tablet Take 1 tablet  by mouth daily.  . metFORMIN (GLUCOPHAGE) 500 MG tablet Take 1 tablet (500 mg total) by mouth 2 (two) times daily with a meal. Take one tablet by mouth with dinner for the first two weeks, then increase to one tab with breakfast and one tab with dinner   No facility-administered encounter medications on file as of 05/16/2017.     Allergies: Patient has no known allergies.  Body mass index is 24.88 kg/m.  Blood pressure (!) 179/92, pulse 83, height 5\' 10"  (1.778 m), weight 173 lb 6.4 oz (78.7 kg).    Review of Systems  Constitutional: Positive for fatigue. Negative for activity change, appetite change, chills, diaphoresis, fever and unexpected weight change.  HENT: Negative for congestion.   Eyes: Positive for visual disturbance.  Respiratory: Negative for cough, chest tightness, shortness of breath, wheezing and stridor.   Cardiovascular: Negative for chest pain, palpitations and leg swelling.  Gastrointestinal: Negative for abdominal distention, abdominal pain, blood in stool, constipation, diarrhea, nausea and vomiting.  Endocrine: Negative for cold intolerance, heat intolerance, polydipsia, polyphagia and polyuria.  Genitourinary: Negative for difficulty urinating and flank pain.  Skin: Negative for color change, pallor, rash and wound.  Allergic/Immunologic: Negative for immunocompromised state.  Neurological: Negative for tremors, facial asymmetry, weakness and light-headedness.  Hematological: Does not bruise/bleed easily.  Psychiatric/Behavioral: Negative  for behavioral problems, decreased concentration, dysphoric mood, hallucinations, self-injury, sleep disturbance and suicidal ideas. The patient is not nervous/anxious and is not hyperactive.        Objective:   Physical Exam  Constitutional: He is oriented to person, place, and time. He appears well-developed and well-nourished. No distress.  HENT:  Head: Normocephalic and atraumatic.  Right Ear: External ear normal.    Left Ear: External ear normal.  Ruddy nose  Eyes: Pupils are equal, round, and reactive to light. Conjunctivae are normal.  Cardiovascular: Normal rate, regular rhythm, normal heart sounds and intact distal pulses.   No murmur heard. Pulmonary/Chest: Effort normal and breath sounds normal. No respiratory distress. He has no wheezes. He has no rales. He exhibits no tenderness.  Musculoskeletal: He exhibits edema.       Right ankle: He exhibits swelling.       Left ankle: He exhibits swelling.  Neurological: He is alert and oriented to person, place, and time.  Skin: Skin is warm, dry and intact. No rash noted. He is not diaphoretic.  Extremely ruddy face/neck  Psychiatric: He has a normal mood and affect. His behavior is normal. Judgment and thought content normal.  Nursing note and vitals reviewed.         Assessment & Plan:   1. On statin therapy   2. Essential hypertension   3. Hyperlipidemia associated with type 2 diabetes mellitus ([place])   4. Type 2 diabetes mellitus with complication, without long-term current use of insulin ([facility])     HTN (hypertension) BP well above goal 179/92, HR 83 Bil ankle edema Added HCTZ to lisinopril Continue to avoid Na++ and instructed to abstain from [place] BP daily and bring log to f/u in 4 weeks  Hyperlipidemia associated with type 2 diabetes mellitus ([place]) 04/15/17  Ref Range & Units 50mo ago  Cholesterol, Total 100 - 199 mg/dL 209    Triglycerides 0 - 149 mg/dL 95   HDL >39 mg/dL 45   VLDL Cholesterol Cal 5 - 40 mg/dL 19   LDL Calculated 0 - 99 mg/dL 145    Chol/HDL Ratio 0.0 - 5.0 ratio 4.6   Comment:                 ASCVD risk 33%, optimal 10.3% Started on Atorvastatin 10mg -push water and abstain from [place] Reduce saturated fat Will re-check hepatic panel at f/u in 4 weeks  Type 2 diabetes mellitus ([place]) 04/15/17 A1c 7.2 Hx of T2D Started on Metformin 500mg  Q dinner for two weeks, then increased to  BID Check BS daily and bring log at f/u in 4 weeks. Reduce CHO/sugar and abstain from [place] Continue daily walking    FOLLOW-UP:  Return in about 4 weeks (around 06/13/2017) for Regular Follow Up, HTN, Hypercholestermia, Diabetes, Evaluate Medication Effectiveness.

## 2017-05-16 NOTE — Assessment & Plan Note (Signed)
04/15/17  Ref Range & Units 55mo ago  Cholesterol, Total 100 - 199 mg/dL 209    Triglycerides 0 - 149 mg/dL 95   HDL >39 mg/dL 45   VLDL Cholesterol Cal 5 - 40 mg/dL 19   LDL Calculated 0 - 99 mg/dL 145    Chol/HDL Ratio 0.0 - 5.0 ratio 4.6   Comment:                 ASCVD risk 33%, optimal 10.3% Started on Atorvastatin 10mg -push water and abstain from [place] Reduce saturated fat Will re-check hepatic panel at f/u in 4 weeks

## 2017-05-16 NOTE — Assessment & Plan Note (Signed)
BP well above goal 179/92, HR 83 Bil ankle edema Added HCTZ to lisinopril Continue to avoid Na++ and instructed to abstain from [place] BP daily and bring log to f/u in 4 weeks

## 2017-06-12 ENCOUNTER — Ambulatory Visit (INDEPENDENT_AMBULATORY_CARE_PROVIDER_SITE_OTHER): Payer: Medicare Other | Admitting: Adult Health

## 2017-06-12 ENCOUNTER — Encounter: Payer: Self-pay | Admitting: Adult Health

## 2017-06-12 DIAGNOSIS — E785 Hyperlipidemia, unspecified: Secondary | ICD-10-CM

## 2017-06-12 DIAGNOSIS — I10 Essential (primary) hypertension: Secondary | ICD-10-CM

## 2017-06-12 DIAGNOSIS — E1169 Type 2 diabetes mellitus with other specified complication: Secondary | ICD-10-CM | POA: Diagnosis not present

## 2017-06-12 NOTE — Patient Instructions (Signed)
Diabetes Mellitus and Food It is important for you to manage your blood sugar (glucose) level. Your blood glucose level can be greatly affected by what you eat. Eating healthier foods in the appropriate amounts throughout the day at about the same time each day will help you control your blood glucose level. It can also help slow or prevent worsening of your diabetes mellitus. Healthy eating may even help you improve the level of your blood pressure and reach or maintain a healthy weight. General recommendations for healthful eating and cooking habits include:  Eating meals and snacks regularly. Avoid going long periods of time without eating to lose weight.  Eating a diet that consists mainly of plant-based foods, such as fruits, vegetables, nuts, legumes, and whole grains.  Using low-heat cooking methods, such as baking, instead of high-heat cooking methods, such as deep frying.  Work with your dietitian to make sure you understand how to use the Nutrition Facts information on food labels. How can food affect me? Carbohydrates Carbohydrates affect your blood glucose level more than any other type of food. Your dietitian will help you determine how many carbohydrates to eat at each meal and teach you how to count carbohydrates. Counting carbohydrates is important to keep your blood glucose at a healthy level, especially if you are using insulin or taking certain medicines for diabetes mellitus. Alcohol Alcohol can cause sudden decreases in blood glucose (hypoglycemia), especially if you use insulin or take certain medicines for diabetes mellitus. Hypoglycemia can be a life-threatening condition. Symptoms of hypoglycemia (sleepiness, dizziness, and disorientation) are similar to symptoms of having too much alcohol. If your health care provider has given you approval to drink alcohol, do so in moderation and use the following guidelines:  Women should not have more than one drink per day, and men  should not have more than two drinks per day. One drink is equal to: ? 12 oz of beer. ? 5 oz of wine. ? 1 oz of hard liquor.  Do not drink on an empty stomach.  Keep yourself hydrated. Have water, diet soda, or unsweetened iced tea.  Regular soda, juice, and other mixers might contain a lot of carbohydrates and should be counted.  What foods are not recommended? As you make food choices, it is important to remember that all foods are not the same. Some foods have fewer nutrients per serving than other foods, even though they might have the same number of calories or carbohydrates. It is difficult to get your body what it needs when you eat foods with fewer nutrients. Examples of foods that you should avoid that are high in calories and carbohydrates but low in nutrients include:  Trans fats (most processed foods list trans fats on the Nutrition Facts label).  Regular soda.  Juice.  Candy.  Sweets, such as cake, pie, doughnuts, and cookies.  Fried foods.  What foods can I eat? Eat nutrient-rich foods, which will nourish your body and keep you healthy. The food you should eat also will depend on several factors, including:  The calories you need.  The medicines you take.  Your weight.  Your blood glucose level.  Your blood pressure level.  Your cholesterol level.  You should eat a variety of foods, including:  Protein. ? Lean cuts of meat. ? Proteins low in saturated fats, such as fish, egg whites, and beans. Avoid processed meats.  Fruits and vegetables. ? Fruits and vegetables that may help control blood glucose levels, such as apples,   mangoes, and yams.  Dairy products. ? Choose fat-free or low-fat dairy products, such as milk, yogurt, and cheese.  Grains, bread, pasta, and rice. ? Choose whole grain products, such as multigrain bread, whole oats, and brown rice. These foods may help control blood pressure.  Fats. ? Foods containing healthful fats, such as  nuts, avocado, olive oil, canola oil, and fish.  Does everyone with diabetes mellitus have the same meal plan? Because every person with diabetes mellitus is different, there is not one meal plan that works for everyone. It is very important that you meet with a dietitian who will help you create a meal plan that is just right for you. This information is not intended to replace advice given to you by your health care provider. Make sure you discuss any questions you have with your health care provider. Document Released: 06/14/2005 Document Revised: 02/23/2016 Document Reviewed: 08/14/2013 Elsevier Interactive Patient Education  2017 [place]!!! Continue all medications as directed and please schedule complete physical in 1 month. Cleared for cataract surgery-form faxed to [facility]. SO PROUD OF YOU!

## 2017-06-12 NOTE — Assessment & Plan Note (Signed)
AM BS 100-120s, denies hypoglycemia. Continue Metformin 500mg  BID Will re-check A1c at CPE next month. Diabetic foot exam neg for neuropathy.

## 2017-06-12 NOTE — Assessment & Plan Note (Signed)
BP at goal 120/72, HR 87 Continue Lisinopril/HCTZ 10/12.5mg  Diabetic diet and continue regular movement-walking, yard work [employer] for cataract surgery.

## 2017-06-12 NOTE — Progress Notes (Signed)
Subjective:    Patient ID: Jason [name], male    DOB: 03/24/1946, 71 y.o.   MRN: [id number]  HPI:  Jason [name] is here for f/u: HTN, T2D, he is ultimately seeking medical clearance cataract surgery. He has been compliant on antihypertensives, Metformin, and statin.  He denies SE and reports that AM BS running 100-120s, he denies episodes of hypoglycemia.  He walks >78mle and day and works in the yard most days of the week.   He has been reducing saturated fat/CHO from diet and has restricted EOTH use to weekends only. He states " I feel better now that I have in years"-GREAT!  Patient Care Team    Relationship Specialty Notifications Start End  DEsaw Grandchild NP PCP - General Family Medicine  04/15/17     Patient Active Problem List   Diagnosis Date Noted  . Hyperlipidemia associated with type 2 diabetes mellitus ([place] 05/16/2017  . Type 2 diabetes mellitus ([place] 05/16/2017  . HTN (hypertension) 04/15/2017  . Routine health maintenance 04/15/2017  . Fatigue 04/15/2017     Past Medical History:  Diagnosis Date  . Cataract   . Diabetes mellitus without complication ([place]   . Hypertension   . Wrist injury      Past Surgical History:  Procedure Laterality Date  . APPENDECTOMY    . CATARACT EXTRACTION       Family History  Problem Relation Age of Onset  . Stroke Mother   . Arthritis Father      History  Drug Use No     History  Alcohol Use  . 2.4 oz/week  . 4 Cans of beer per week     History  Smoking Status  . Never Smoker  Smokeless Tobacco  . Never Used     Outpatient Encounter Prescriptions as of 06/12/2017  Medication Sig  . atorvastatin (LIPITOR) 10 MG tablet Take 1 tablet (10 mg total) by mouth daily.  . blood glucose meter kit and supplies Please dispense One Touch Ultra Kit for use to check fasting blood sugars once daily  . glucose blood (ONE TOUCH ULTRA TEST) test strip Use to check fasting blood sugars once daily  .  lisinopril-hydrochlorothiazide (PRINZIDE,ZESTORETIC) 10-12.5 MG tablet Take 1 tablet by mouth daily.  . metFORMIN (GLUCOPHAGE) 500 MG tablet Take 1 tablet (500 mg total) by mouth 2 (two) times daily with a meal. Take one tablet by mouth with dinner for the first two weeks, then increase to one tab with breakfast and one tab with dinner   No facility-administered encounter medications on file as of 06/12/2017.     Allergies: Patient has no known allergies.  Body mass index is 24.39 kg/m.  Blood pressure (!) 144/84, pulse (!) 101, height _0  (1.778 m), weight 170 lb (77.1 kg).      Review of Systems  Constitutional: Positive for fatigue. Negative for activity change, appetite change, chills, diaphoresis, fever and unexpected weight change.  HENT: Negative for congestion.   Respiratory: Negative for cough, chest tightness, shortness of breath, wheezing and stridor.   Cardiovascular: Negative for chest pain, palpitations and leg swelling.  Gastrointestinal: Negative for abdominal distention, abdominal pain, blood in stool, constipation, diarrhea, nausea and vomiting.  Endocrine: Negative for cold intolerance, heat intolerance, polydipsia, polyphagia and polyuria.  Genitourinary: Negative for difficulty urinating and flank pain.  Neurological: Negative for dizziness and headaches.  Hematological: Does not bruise/bleed easily.  Psychiatric/Behavioral: Negative for sleep disturbance.  Objective:   Physical Exam  Constitutional: He is oriented to person, place, and time. He appears well-developed and well-nourished. No distress.  HENT:  Head: Normocephalic.  Right Ear: External ear normal.  Left Ear: External ear normal.  Cardiovascular: Normal rate, regular rhythm, normal heart sounds and intact distal pulses.   No murmur heard. Pulmonary/Chest: Effort normal and breath sounds normal. No respiratory distress. He has no wheezes. He has no rales. He exhibits no tenderness.   Neurological: He is alert and oriented to person, place, and time.  Skin: Skin is warm and dry. No rash noted. He is not diaphoretic. No pallor.  Less of ruddy appearance then pervious OV  Psychiatric: He has a normal mood and affect. His behavior is normal. Judgment and thought content normal.  Nursing note and vitals reviewed.         Assessment & Plan:   1. Essential hypertension   2. Hyperlipidemia associated with type 2 diabetes mellitus (HCC)     HTN (hypertension) BP at goal 120/72, HR 87 Continue Lisinopril/HCTZ 10/12.68m Diabetic diet and continue regular movement-walking, yard [facility].  Hyperlipidemia associated with type 2 diabetes mellitus (HCC) AM BS 100-120s, denies hypoglycemia. Continue Metformin 5042mBID Will re-check A1c at CPE next month. Diabetic foot exam neg for neuropathy.    FOLLOW-UP:  No Follow-up on file.

## 2017-07-04 ENCOUNTER — Other Ambulatory Visit: Payer: Self-pay | Admitting: Adult Health

## 2017-07-04 DIAGNOSIS — E118 Type 2 diabetes mellitus with unspecified complications: Secondary | ICD-10-CM

## 2017-07-11 ENCOUNTER — Encounter: Payer: Self-pay | Admitting: Adult Health

## 2017-07-11 ENCOUNTER — Ambulatory Visit (INDEPENDENT_AMBULATORY_CARE_PROVIDER_SITE_OTHER): Payer: Medicare Other | Admitting: Adult Health

## 2017-07-11 VITALS — BP 166/88 | HR 86 | Ht 70.0 in | Wt 173.5 lb

## 2017-07-11 DIAGNOSIS — Z1211 Encounter for screening for malignant neoplasm of colon: Secondary | ICD-10-CM

## 2017-07-11 DIAGNOSIS — Z0000 Encounter for general adult medical examination without abnormal findings: Secondary | ICD-10-CM | POA: Diagnosis not present

## 2017-07-11 DIAGNOSIS — E119 Type 2 diabetes mellitus without complications: Secondary | ICD-10-CM

## 2017-07-11 DIAGNOSIS — E785 Hyperlipidemia, unspecified: Secondary | ICD-10-CM | POA: Diagnosis not present

## 2017-07-11 DIAGNOSIS — Z79899 Other long term (current) drug therapy: Secondary | ICD-10-CM

## 2017-07-11 DIAGNOSIS — Z5181 Encounter for therapeutic drug level monitoring: Secondary | ICD-10-CM

## 2017-07-11 DIAGNOSIS — E1169 Type 2 diabetes mellitus with other specified complication: Secondary | ICD-10-CM | POA: Diagnosis not present

## 2017-07-11 DIAGNOSIS — Z87891 Personal history of nicotine dependence: Secondary | ICD-10-CM | POA: Diagnosis not present

## 2017-07-11 DIAGNOSIS — I10 Essential (primary) hypertension: Secondary | ICD-10-CM | POA: Diagnosis not present

## 2017-07-11 LAB — POCT UA - MICROALBUMIN
CREATININE, POC: 300 mg/dL
Microalbumin Ur, POC: 80 mg/L

## 2017-07-11 LAB — POC HEMOCCULT BLD/STL (OFFICE/1-CARD/DIAGNOSTIC): FECAL OCCULT BLD: NEGATIVE

## 2017-07-11 LAB — POCT GLYCOSYLATED HEMOGLOBIN (HGB A1C): Hemoglobin A1C: 6.6

## 2017-07-11 NOTE — Progress Notes (Signed)
Subjective:    Patient ID: Jason [name], male    DOB: 1946-03-19, 71 y.o.   MRN: [id number]  HPI:  912/2018 OV: Mr. Jason [name] is here for f/u: HTN, T2D, he is ultimately seeking medical clearance cataract surgery. He has been compliant on antihypertensives, Metformin, and statin.  He denies SE and reports that AM BS running 100-120s, he denies episodes of hypoglycemia.  He walks >14mle and day and works in the yard most days of the week.   He has been reducing saturated fat/CHO from diet and has restricted EOTH use to weekends only. He states " I feel better now that I have in years"-GREAT!  Today's OV 07/11/2017: [name] here for CPE.  He reports compliancy on medications and denies SE. He has been continuing to reduce saturated fat/sugar/CHO/ETOH use.  He moves on regular basis- yard work and walking.  Overall he continues to feel "pretty darn great". Healthcare maintenance: Colonoscopy due 2010 10 pack/yr hx- denies ever having AAA screening Immunizations UTD Patient Care Team    Relationship Specialty Notifications Start End  DMina MarbleD, NP PCP - General Family Medicine  04/15/17   MBarbaraann [name] OLantanaReferring Physician Optometry  06/12/17     Patient Active Problem List   Diagnosis Date Noted  . Hyperlipidemia associated with type 2 diabetes mellitus (HB[place] 05/16/2017  . Type 2 diabetes mellitus (H[place] 05/16/2017  . HTN (hypertension) 04/15/2017  . Routine health maintenance 04/15/2017  . Fatigue 04/15/2017     Past Medical History:  Diagnosis Date  . Cataract   . Diabetes mellitus without complication (HW[place]   . Hypertension   . Wrist injury      Past Surgical History:  Procedure Laterality Date  . APPENDECTOMY    . CATARACT EXTRACTION       Family History  Problem Relation Age of Onset  . Stroke Mother   . Arthritis Father      History  Drug Use No     History  Alcohol Use  . 2.4 oz/week  . 4 Cans of beer per week      History  Smoking Status  . Never Smoker  Smokeless Tobacco  . Never Used     Outpatient Encounter Prescriptions as of 07/11/2017  Medication Sig  . atorvastatin (LIPITOR) 10 MG tablet Take 1 tablet (10 mg total) by mouth daily.  . blood glucose meter kit and supplies Please dispense One Touch Ultra Kit for use to check fasting blood sugars once daily  . lisinopril-hydrochlorothiazide (PRINZIDE,ZESTORETIC) 10-12.5 MG tablet Take 1 tablet by mouth daily.  . metFORMIN (GLUCOPHAGE) 500 MG tablet Take 1 tablet (500 mg total) by mouth 2 (two) times daily with a meal. Take one tablet by mouth with dinner for the first two weeks, then increase to one tab with breakfast and one tab with dinner  . ONE TOUCH ULTRA TEST test strip USE TO CHECK BLOOD GLUCOSE  FASTING ONCE DAILY  . ONETOUCH DELICA LANCETS 330QMISC USE TO CHECK BLOOD GLUCOSE  FASTING ONCE DAILY   No facility-administered encounter medications on file as of 07/11/2017.     Allergies: Patient has no known allergies.  Body mass index is 24.89 kg/m.  Blood pressure (!) 166/88, pulse 86, height '5\' 10"'  (1.778 m), weight 173 lb 8 oz (78.7 kg).      Review of Systems  Constitutional: Positive for fatigue. Negative for activity change, appetite change, chills, diaphoresis, fever and unexpected weight change.  HENT:  Negative for congestion.   Respiratory: Negative for cough, chest tightness, shortness of breath, wheezing and stridor.   Cardiovascular: Negative for chest pain, palpitations and leg swelling.  Gastrointestinal: Negative for abdominal distention, abdominal pain, blood in stool, constipation, diarrhea, nausea and vomiting.  Endocrine: Negative for cold intolerance, heat intolerance, polydipsia, polyphagia and polyuria.  Genitourinary: Negative for difficulty urinating and flank pain.  Neurological: Negative for dizziness and headaches.  Hematological: Does not bruise/bleed easily.  Psychiatric/Behavioral:  Negative for sleep disturbance.       Objective:   Physical Exam  Constitutional: He is oriented to person, place, and time. He appears well-developed and well-nourished. No distress.  HENT:  Head: Normocephalic.  Right Ear: Tympanic membrane, external ear and ear canal normal. Tympanic membrane is not erythematous and not bulging. Decreased hearing is noted.  Left Ear: Tympanic membrane, external ear and ear canal normal. Tympanic membrane is not erythematous and not bulging. Decreased hearing is noted.  Nose: Nose normal. Right sinus exhibits no maxillary sinus tenderness and no frontal sinus tenderness. Left sinus exhibits no maxillary sinus tenderness and no frontal sinus tenderness.  Mouth/Throat: Uvula is midline.  Eyes: Pupils are equal, round, and reactive to light. Conjunctivae are normal.  Neck: Normal range of motion. Neck supple.  Cardiovascular: Normal rate, regular rhythm, normal heart sounds and intact distal pulses.   No murmur heard. Pulmonary/Chest: Effort normal and breath sounds normal. No respiratory distress. He has no wheezes. He has no rales. He exhibits no tenderness.  Abdominal: Soft. Bowel sounds are normal. He exhibits no distension and no mass. There is no tenderness. There is no rebound and no guarding. Hernia confirmed negative in the right inguinal area and confirmed negative in the left inguinal area.  Genitourinary: Rectum normal, prostate normal, testes normal and penis normal. Rectal exam shows guaiac negative stool.  Genitourinary Comments: Chaperone present during examination.  Musculoskeletal: Normal range of motion.  Lymphadenopathy:    He has no cervical adenopathy.       Right: No inguinal adenopathy present.       Left: No inguinal adenopathy present.  Neurological: He is alert and oriented to person, place, and time.  Skin: Skin is warm and dry. No rash noted. He is not diaphoretic. No pallor.  Less of ruddy appearance then pervious OV   Psychiatric: He has a normal mood and affect. His behavior is normal. Judgment and thought content normal.  Nursing note and vitals reviewed.         Assessment & Plan:   1. Type 2 diabetes mellitus without complication, without long-term current use of insulin ([place])   2. Hyperlipidemia associated with type 2 diabetes mellitus ([place])   3. Encounter for monitoring statin therapy   4. Screening for colon cancer   5. Hx of tobacco use, presenting hazards to health   6. Essential hypertension   7. Routine health maintenance     HTN (hypertension) BP checked twice, above goal- he did not take antihypertensive this am due to fasting labs. He reports consistently taking each morning otherwise.  Hyperlipidemia associated with type 2 diabetes mellitus (HCC) A1c today 6.6, improved from 7.2 on 04/15/2017 Continue Metformin 550m BID and regular movement. Microalbumin abnormal- discussed with pt- r/t uncontrolled T2D/HTN. Stressed importance of medication compliance and healthy diet.  Lipids/hepatic fx checked today. Discussed CDC safe drinking guidelines  Routine health maintenance DRE completed Guaiac: negative Next colon[place] due 2010 [place] due to 10 pack/yr hx  FOLLOW-UP:  Return in about 3 months (around 10/11/2017) for Regular Follow Up, HTN, Hypercholestermia, Diabetes, A1c re-check. 1. Type 2 diabetes mellitus without complication, without long-term current use of insulin ([place])   2. Hyperlipidemia associated with type 2 diabetes mellitus ([place])   3. Encounter for monitoring statin therapy   4. Screening for colon cancer   5. Hx of tobacco use, presenting hazards to health   6. Essential hypertension   7. Routine health maintenance     HTN (hypertension) BP checked twice, above goal- he did not take antihypertensive this am due to fasting labs. He reports consistently taking each morning otherwise.  Hyperlipidemia associated with type 2  diabetes mellitus (HCC) A1c today 6.6, improved from 7.2 on 04/15/2017 Continue Metformin 541m BID and regular movement. Microalbumin abnormal- discussed with pt- r/t uncontrolled T2D/HTN. Stressed importance of medication compliance and healthy diet.  Lipids/hepatic fx checked today. Discussed CDC safe drinking guidelines  Routine health maintenance DRE completed Guaiac: negative Next colonoscopy due 2010 [place] placed due to 10 pack/yr hx      FOLLOW-UP:  Return in about 3 months (around 10/11/2017) for Regular Follow Up, HTN, Hypercholestermia, Diabetes, A1c re-check.

## 2017-07-11 NOTE — Assessment & Plan Note (Signed)
BP checked twice, above goal- he did not take antihypertensive this am due to fasting labs. He reports consistently taking each morning otherwise.

## 2017-07-11 NOTE — Assessment & Plan Note (Signed)
DRE completed Guaiac: negative Next colonoscopy due 2010 [place] abdominal order placed due to 10 pack/yr hx

## 2017-07-11 NOTE — Patient Instructions (Addendum)
Heart-Healthy Eating Plan Heart-healthy meal planning includes:  Limiting unhealthy fats.  Increasing healthy fats.  Making other small dietary changes.  You may need to talk with your doctor or a diet specialist (dietitian) to create an eating plan that is right for you. What types of fat should I choose?  Choose healthy fats. These include olive oil and canola oil, flaxseeds, walnuts, almonds, and seeds.  Eat more omega-3 fats. These include salmon, mackerel, sardines, tuna, flaxseed oil, and ground flaxseeds. Try to eat fish at least twice each week.  Limit saturated fats. ? Saturated fats are often found in animal products, such as meats, butter, and cream. ? Plant sources of saturated fats include palm oil, palm kernel oil, and coconut oil.  Avoid foods with partially hydrogenated oils in them. These include stick margarine, some tub margarines, cookies, crackers, and other baked goods. These contain trans fats. What general guidelines do I need to follow?  Check food labels carefully. Identify foods with trans fats or high amounts of saturated fat.  Fill one half of your plate with vegetables and green salads. Eat 4-5 servings of vegetables per day. A serving of vegetables is: ? 1 cup of raw leafy vegetables. ?  cup of raw or cooked cut-up vegetables. ?  cup of vegetable juice.  Fill one fourth of your plate with whole grains. Look for the word "whole" as the first word in the ingredient list.  Fill one fourth of your plate with lean protein foods.  Eat 4-5 servings of fruit per day. A serving of fruit is: ? One medium whole fruit. ?  cup of dried fruit. ?  cup of fresh, frozen, or canned fruit. ?  cup of 100% fruit juice.  Eat more foods that contain soluble fiber. These include apples, broccoli, carrots, beans, peas, and barley. Try to get 20-30 g of fiber per day.  Eat more home-cooked food. Eat less restaurant, buffet, and fast food.  Limit or avoid  alcohol.  Limit foods high in starch and sugar.  Avoid fried foods.  Avoid frying your food. Try baking, boiling, grilling, or broiling it instead. You can also reduce fat by: ? Removing the skin from poultry. ? Removing all visible fats from meats. ? Skimming the fat off of stews, soups, and gravies before serving them. ? Steaming vegetables in water or broth.  Lose weight if you are overweight.  Eat 4-5 servings of nuts, legumes, and seeds per week: ? One serving of dried beans or legumes equals  cup after being cooked. ? One serving of nuts equals 1 ounces. ? One serving of seeds equals  ounce or one tablespoon.  You may need to keep track of how much salt or sodium you eat. This is especially true if you have high blood pressure. Talk with your doctor or dietitian to get more information. What foods can I eat? Grains Breads, including French, white, pita, wheat, raisin, rye, oatmeal, and [place]. Tortillas that are neither fried nor made with lard or trans fat. Low-fat rolls, including hotdog and hamburger buns and English muffins. Biscuits. Muffins. Waffles. Pancakes. Light popcorn. Whole-grain cereals. Flatbread. Melba toast. Pretzels. Breadsticks. Rusks. Low-fat snacks. Low-fat crackers, including oyster, saltine, matzo, graham, animal, and rye. Rice and pasta, including brown rice and pastas that are made with whole wheat. Vegetables All vegetables. Fruits All fruits, but limit coconut. Meats and Other Protein Sources Lean, well-trimmed beef, veal, pork, and lamb. Chicken and turkey without skin. All fish and shellfish.   Wild duck, rabbit, pheasant, and venison. Egg whites or low-cholesterol egg substitutes. Dried beans, peas, lentils, and tofu. Seeds and most nuts. Dairy Low-fat or nonfat cheeses, including ricotta, string, and mozzarella. Skim or 1% milk that is liquid, powdered, or evaporated. Buttermilk that is made with low-fat milk. Nonfat or low-fat  yogurt. Beverages Mineral water. Diet carbonated beverages. Sweets and Desserts Sherbets and fruit ices. Honey, jam, marmalade, jelly, and syrups. Meringues and gelatins. Pure sugar candy, such as hard candy, jelly beans, gumdrops, mints, marshmallows, and small amounts of dark chocolate. [employer] Eat all sweets and desserts in moderation. Fats and Oils Nonhydrogenated (trans-free) margarines. Vegetable oils, including soybean, sesame, sunflower, olive, peanut, safflower, corn, canola, and cottonseed. Salad dressings or mayonnaise made with a vegetable oil. Limit added fats and oils that you use for cooking, baking, salads, and as spreads. Other Cocoa powder. Coffee and tea. All seasonings and condiments. The items listed above may not be a complete list of recommended foods or beverages. Contact your dietitian for more options. What foods are not recommended? Grains Breads that are made with saturated or trans fats, oils, or whole milk. Croissants. Butter rolls. Cheese breads. Sweet rolls. Donuts. Buttered popcorn. Chow mein noodles. High-fat crackers, such as cheese or butter crackers. Meats and Other Protein Sources Fatty meats, such as hotdogs, short ribs, sausage, spareribs, bacon, rib eye roast or steak, and mutton. High-fat deli meats, such as salami and bologna. Caviar. Domestic duck and goose. Organ meats, such as kidney, liver, sweetbreads, and heart. Dairy Cream, sour cream, cream cheese, and creamed cottage cheese. Whole-milk cheeses, including blue (bleu), Monterey Jack, [place], [place], American, [place], Swiss, cheddar, [place], and [place]. Whole or 2% milk that is liquid, evaporated, or condensed. Whole buttermilk. Cream sauce or high-fat cheese sauce. Yogurt that is made from whole milk. Beverages Regular sodas and juice drinks with added sugar. Sweets and Desserts Frosting. Pudding. Cookies. Cakes other than angel food cake. Candy that has milk chocolate or white  chocolate, hydrogenated fat, butter, coconut, or unknown ingredients. Buttered syrups. Full-fat ice cream or ice cream drinks. Fats and Oils Gravy that has suet, meat fat, or shortening. Cocoa butter, hydrogenated oils, palm oil, coconut oil, palm kernel oil. These can often be found in baked products, candy, fried foods, nondairy creamers, and whipped toppings. Solid fats and shortenings, including bacon fat, salt pork, lard, and butter. Nondairy cream substitutes, such as coffee creamers and sour cream substitutes. Salad dressings that are made of unknown oils, cheese, or sour cream. The items listed above may not be a complete list of foods and beverages to avoid. Contact your dietitian for more information.   This information is not intended to replace advice given to you by your health care provider. Make sure you discuss any questions you have with your health care provider. Document Released: 03/18/2012 Document Revised: 02/23/2016 Document Reviewed: 03/11/2014 Elsevier Interactive Patient Education  2018 [place].  A1c today 6.6, reduced from 7.2 on 04/15/17 Please continue all medications as directed. Continue excellent water intake and heart healthy diet. Try to keep alcohol use to a minimum- no more than 2 drinks/day.  Abdominal ultrasound order placed, screening recommended with your history of tobacco use. We will call when lab results are available. Follow-up in 3 months. NICE TO SEE YOU!

## 2017-07-11 NOTE — Assessment & Plan Note (Addendum)
A1c today 6.6, improved from 7.2 on 04/15/2017 Continue Metformin 500mg  BID and regular movement. Microalbumin abnormal- discussed with pt- r/t uncontrolled T2D/HTN. Stressed importance of medication compliance and healthy diet.  Lipids/hepatic fx checked today. Discussed CDC safe drinking guidelines

## 2017-07-12 LAB — HEPATIC FUNCTION PANEL
ALT: 18 IU/L (ref 0–44)
AST: 19 IU/L (ref 0–40)
Albumin: 4.6 g/dL (ref 3.6–4.8)
Alkaline Phosphatase: 57 IU/L (ref 39–117)
BILIRUBIN, DIRECT: 0.17 mg/dL (ref 0.00–0.40)
Bilirubin Total: 0.5 mg/dL (ref 0.0–1.2)
Total Protein: 7 g/dL (ref 6.0–8.5)

## 2017-07-12 LAB — LIPID PANEL
CHOL/HDL RATIO: 3 ratio (ref 0.0–5.0)
Cholesterol, Total: 187 mg/dL (ref 100–199)
HDL: 62 mg/dL (ref >39)
LDL CALC: 108 mg/dL — AB (ref 0–99)
TRIGLYCERIDES: 83 mg/dL (ref 0–149)
VLDL Cholesterol Cal: 17 mg/dL (ref 5–40)

## 2017-07-18 ENCOUNTER — Ambulatory Visit
Admission: RE | Admit: 2017-07-18 | Discharge: 2017-07-18 | Disposition: A | Discharge status: Home / Self Care | Payer: Medicare Other | Source: Ambulatory Visit | Attending: Adult Health | Admitting: Adult Health

## 2017-07-18 DIAGNOSIS — Z87891 Personal history of nicotine dependence: Secondary | ICD-10-CM

## 2017-09-09 ENCOUNTER — Other Ambulatory Visit: Payer: Self-pay | Admitting: Adult Health

## 2017-09-09 DIAGNOSIS — E118 Type 2 diabetes mellitus with unspecified complications: Secondary | ICD-10-CM

## 2017-10-14 ENCOUNTER — Ambulatory Visit: Payer: Medicare Other | Admitting: Adult Health

## 2017-10-14 ENCOUNTER — Encounter: Payer: Self-pay | Admitting: Adult Health

## 2017-10-14 VITALS — BP 115/76 | HR 96 | Ht 70.0 in | Wt 184.4 lb

## 2017-10-14 DIAGNOSIS — E119 Type 2 diabetes mellitus without complications: Secondary | ICD-10-CM

## 2017-10-14 DIAGNOSIS — I10 Essential (primary) hypertension: Secondary | ICD-10-CM | POA: Diagnosis not present

## 2017-10-14 DIAGNOSIS — E785 Hyperlipidemia, unspecified: Secondary | ICD-10-CM | POA: Diagnosis not present

## 2017-10-14 DIAGNOSIS — Z23 Encounter for immunization: Secondary | ICD-10-CM

## 2017-10-14 DIAGNOSIS — E1169 Type 2 diabetes mellitus with other specified complication: Secondary | ICD-10-CM

## 2017-10-14 LAB — POCT GLYCOSYLATED HEMOGLOBIN (HGB A1C): Hemoglobin A1C: 6.4

## 2017-10-14 NOTE — Assessment & Plan Note (Signed)
BP at goal 115/76, HR 96 Continue Prinzide 10/12.5mg  daily He denies acute cardiac compliants

## 2017-10-14 NOTE — Assessment & Plan Note (Signed)
Lab Results  Component Value Date   HGBA1C 6.4 10/14/2017   HGBA1C 6.6 07/11/2017   HGBA1C 7.2 (H) 04/15/2017   Home AM BC 100-120s, no episodes of hypoglycemia Continue Metformin 500mg  BID

## 2017-10-14 NOTE — Patient Instructions (Addendum)
Heart-Healthy Eating Plan Many factors influence your heart health, including eating and exercise habits. Heart (coronary) risk increases with abnormal blood fat (lipid) levels. Heart-healthy meal planning includes limiting unhealthy fats, increasing healthy fats, and making other small dietary changes. This includes maintaining a healthy body weight to help keep lipid levels within a normal range. What is my plan? Your health care provider recommends that you:  Get no more than ___25___% of the total calories in your daily diet from fat.  Limit your intake of saturated fat to less than ___5___% of your total calories each day.  Limit the amount of cholesterol in your diet to less than __300___ mg per day.  What types of fat should I choose?  Choose healthy fats more often. Choose monounsaturated and polyunsaturated fats, such as olive oil and canola oil, flaxseeds, walnuts, almonds, and seeds.  Eat more omega-3 fats. Good choices include salmon, mackerel, sardines, tuna, flaxseed oil, and ground flaxseeds. Aim to eat fish at least two times each week.  Limit saturated fats. Saturated fats are primarily found in animal products, such as meats, butter, and cream. Plant sources of saturated fats include palm oil, palm kernel oil, and coconut oil.  Avoid foods with partially hydrogenated oils in them. These contain trans fats. Examples of foods that contain trans fats are stick margarine, some tub margarines, cookies, crackers, and other baked goods. What general guidelines do I need to follow?  Check food labels carefully to identify foods with trans fats or high amounts of saturated fat.  Fill one half of your plate with vegetables and green salads. Eat 4-5 servings of vegetables per day. A serving of vegetables equals 1 cup of raw leafy vegetables,  cup of raw or cooked cut-up vegetables, or  cup of vegetable juice.  Fill one fourth of your plate with whole grains. Look for the word  "whole" as the first word in the ingredient list.  Fill one fourth of your plate with lean protein foods.  Eat 4-5 servings of fruit per day. A serving of fruit equals one medium whole fruit,  cup of dried fruit,  cup of fresh, frozen, or canned fruit, or  cup of 100% fruit juice.  Eat more foods that contain soluble fiber. Examples of foods that contain this type of fiber are apples, broccoli, carrots, beans, peas, and barley. Aim to get 20-30 g of fiber per day.  Eat more home-cooked food and less restaurant, buffet, and fast food.  Limit or avoid alcohol.  Limit foods that are high in starch and sugar.  Avoid fried foods.  Cook foods by using methods other than frying. Baking, boiling, grilling, and broiling are all great options. Other fat-reducing suggestions include: ? Removing the skin from poultry. ? Removing all visible fats from meats. ? Skimming the fat off of stews, soups, and gravies before serving them. ? Steaming vegetables in water or broth.  Lose weight if you are overweight. Losing just 5-10% of your initial body weight can help your overall health and prevent diseases such as diabetes and heart disease.  Increase your consumption of nuts, legumes, and seeds to 4-5 servings per week. One serving of dried beans or legumes equals  cup after being cooked, one serving of nuts equals 1 ounces, and one serving of seeds equals  ounce or 1 tablespoon.  You may need to monitor your salt (sodium) intake, especially if you have high blood pressure. Talk with your health care provider or dietitian to get  more information about reducing sodium. What foods can I eat? Grains  Breads, including [place], white, pita, wheat, raisin, rye, oatmeal, and [place]. Tortillas that are neither fried nor made with lard or trans fat. Low-fat rolls, including hotdog and hamburger buns and English muffins. Biscuits. Muffins. Waffles. Pancakes. Light popcorn. Whole-grain cereals. Flatbread.  Melba toast. Pretzels. Breadsticks. Rusks. Low-fat snacks and crackers, including oyster, saltine, matzo, graham, animal, and rye. Rice and pasta, including brown rice and those that are made with whole wheat. Vegetables All vegetables. Fruits All fruits, but limit coconut. Meats and Other Protein Sources Lean, well-trimmed beef, veal, pork, and lamb. Chicken and [place] without skin. All fish and shellfish. Wild duck, rabbit, pheasant, and venison. Egg whites or low-cholesterol egg substitutes. Dried beans, peas, lentils, and tofu.Seeds and most nuts. Dairy Low-fat or nonfat cheeses, including ricotta, string, and mozzarella. Skim or 1% milk that is liquid, powdered, or evaporated. Buttermilk that is made with low-fat milk. Nonfat or low-fat yogurt. Beverages Mineral water. Diet carbonated beverages. Sweets and Desserts Sherbets and fruit ices. Honey, jam, marmalade, jelly, and syrups. Meringues and gelatins. Pure sugar candy, such as hard candy, jelly beans, gumdrops, mints, marshmallows, and small amounts of dark chocolate. [employer] Eat all sweets and desserts in moderation. Fats and Oils Nonhydrogenated (trans-free) margarines. Vegetable oils, including soybean, sesame, sunflower, olive, peanut, safflower, corn, canola, and cottonseed. Salad dressings or mayonnaise that are made with a vegetable oil. Limit added fats and oils that you use for cooking, baking, salads, and as spreads. Other Cocoa powder. Coffee and tea. All seasonings and condiments. The items listed above may not be a complete list of recommended foods or beverages. Contact your dietitian for more options. What foods are not recommended? Grains Breads that are made with saturated or trans fats, oils, or whole milk. Croissants. Butter rolls. Cheese breads. Sweet rolls. Donuts. Buttered popcorn. Chow mein noodles. High-fat crackers, such as cheese or butter crackers. Meats and Other Protein Sources Fatty meats, such  as hotdogs, short ribs, sausage, spareribs, bacon, ribeye roast or steak, and mutton. High-fat deli meats, such as salami and bologna. Caviar. Domestic duck and goose. Organ meats, such as kidney, liver, sweetbreads, brains, gizzard, chitterlings, and heart. Dairy Cream, sour cream, cream cheese, and creamed cottage cheese. Whole milk cheeses, including blue (bleu), Monterey Jack, [place], [place], American, [place], Swiss, [place], [place], and [place]. Whole or 2% milk that is liquid, evaporated, or condensed. Whole buttermilk. Cream sauce or high-fat cheese sauce. Yogurt that is made from whole milk. Beverages Regular sodas and drinks with added sugar. Sweets and Desserts Frosting. Pudding. Cookies. Cakes other than angel food cake. Candy that has milk chocolate or white chocolate, hydrogenated fat, butter, coconut, or unknown ingredients. Buttered syrups. Full-fat ice cream or ice cream drinks. Fats and Oils Gravy that has suet, meat fat, or shortening. Cocoa butter, hydrogenated oils, palm oil, coconut oil, palm kernel oil. These can often be found in baked products, candy, fried foods, nondairy creamers, and whipped toppings. Solid fats and shortenings, including bacon fat, salt pork, lard, and butter. Nondairy cream substitutes, such as coffee creamers and sour cream substitutes. Salad dressings that are made of unknown oils, cheese, or sour cream. The items listed above may not be a complete list of foods and beverages to avoid. Contact your dietitian for more information. This information is not intended to replace advice given to you by your health care provider. Make sure you discuss any questions you have with your health care  provider. Document Released: 06/26/2008 Document Revised: 04/06/2016 Document Reviewed: 03/11/2014 Elsevier Interactive Patient Education  [employer].  Overall you are doing a great job! Continue all medications, regular walking and reduced alcohol use. I AM  SO PROUD OF YOU! Follow-up in 4 months, sooner if needed! NICE TO SEE YOU!

## 2017-10-14 NOTE — Progress Notes (Signed)
Subjective:    Patient ID: Jason [name], male    DOB: Jan 10, 1946, 72 y.o.   MRN: [id number]  HPI:  912/2018 OV: Jason [name] is here for f/u: HTN, T2D, he is ultimately seeking medical clearance cataract surgery. He has been compliant on antihypertensives, Metformin, and statin.  He denies SE and reports that AM BS running 100-120s, he denies episodes of hypoglycemia.  He walks >62mle and day and works in the yard most days of the week.   He has been reducing saturated fat/CHO from diet and has restricted EOTH use to weekends only. He states " I feel better now that I have in years"-GREAT!  Today's OV 07/11/2017: Jason [name] here for CPE.  He reports compliancy on medications and denies SE. He has been continuing to reduce saturated fat/sugar/CHO/ETOH use.  He moves on regular basis- yard work and walking.  Overall he continues to feel "pretty darn great". Healthcare maintenance: Colonoscopy due 2010 10 pack/yr hx- denies ever having AAA screening Immunizations UTD  10/14/17 OV: Jason [name] here for f/u: HTN,T2D,  Her reports medication compliance and denies SE. Home AM BS 100-110s, he denies hypoglycemia.  He eats a diabetic diet and has completley eliminated sugar-GREAT! He continues to walk daily and denies CP/dyspnea/palpiations. He continues to abstain from tobacco and drastically reduced ETOH use, most days none and when he does enjoy a cocktail he never has more than 2. Overall he is feeling "much better than a few months ago". A1c 6.4 today-GREAT!!  Lab Results  Component Value Date   HGBA1C 6.4 10/14/2017   HGBA1C 6.6 07/11/2017   HGBA1C 7.2 (H) 04/15/2017   Patient Care Team    Relationship Specialty Notifications Start End  DMina MarbleD, NP PCP - General Family Medicine  04/15/17   MBarbaraann Cao OT[place]lorsvilleReferring Physician Optometry  06/12/17     Patient Active Problem List   Diagnosis Date Noted  . Hyperlipidemia associated with type 2  diabetes mellitus ([facility] 05/16/2017  . Type 2 diabetes mellitus ([place] [facility] 05/16/2017  . HTN (hypertension) 04/15/2017  . Routine health maintenance 04/15/2017  . Fatigue 04/15/2017     Past Medical History:  Diagnosis Date  . Cataract   . Diabetes mellitus without complication ([place]   . Hypertension   . Wrist injury      Past Surgical History:  Procedure Laterality Date  . APPENDECTOMY    . CATARACT EXTRACTION       Family History  Problem Relation Age of Onset  . Stroke Mother   . Arthritis Father      Social History   Substance and Sexual Activity  Drug Use No     Social History   Substance and Sexual Activity  Alcohol Use Yes  . Alcohol/week: 2.4 oz  . Types: 4 Cans of beer per week     Social History   Tobacco Use  Smoking Status Never Smoker  Smokeless Tobacco Never Used     Outpatient Encounter Medications as of 10/14/2017  Medication Sig  . atorvastatin (LIPITOR) 10 MG tablet Take 1 tablet (10 mg total) by mouth daily.  . blood glucose meter kit and supplies Please dispense One Touch Ultra Kit for use to check fasting blood sugars once daily  . lisinopril-hydrochlorothiazide (PRINZIDE,ZESTORETIC) 10-12.5 MG tablet Take 1 tablet by mouth daily.  . metFORMIN (GLUCOPHAGE) 500 MG tablet Take 1 tablet (500 mg total) by mouth 2 (two) times daily with a meal. Take one tablet by  mouth with dinner for the first two weeks, then increase to one tab with breakfast and one tab with dinner  . ONE TOUCH ULTRA TEST test strip USE TO CHECK BLOOD GLUCOSE  FASTING ONCE DAILY  . ONETOUCH DELICA LANCETS 16X MISC USE TO CHECK BLOOD GLUCOSE  FASTING ONCE DAILY   No facility-administered encounter medications on file as of 10/14/2017.     Allergies: Patient has no known allergies.  Body mass index is 26.46 kg/m.  Blood pressure 115/76, pulse 96, height '5\' 10"'  (1.778 m), weight 184 lb 6.4 oz (83.6 kg), SpO2 100 %.      Review of Systems  Constitutional: Positive  for fatigue. Negative for activity change, appetite change, chills, diaphoresis, fever and unexpected weight change.  HENT: Negative for congestion.   Respiratory: Negative for cough, chest tightness, shortness of breath, wheezing and stridor.   Cardiovascular: Negative for chest pain, palpitations and leg swelling.  Gastrointestinal: Negative for abdominal distention, abdominal pain, blood in stool, constipation, diarrhea, nausea and vomiting.  Endocrine: Negative for cold intolerance, heat intolerance, polydipsia, polyphagia and polyuria.  Genitourinary: Negative for difficulty urinating and flank pain.  Neurological: Negative for dizziness and headaches.  Hematological: Does not bruise/bleed easily.  Psychiatric/Behavioral: Negative for sleep disturbance.       Objective:   Physical Exam  Constitutional: He is oriented to person, place, and time. He appears well-developed and well-nourished. No distress.  HENT:  Head: Normocephalic.  Right Ear: Tympanic membrane, external ear and ear canal normal. Decreased hearing is noted.  Left Ear: Tympanic membrane, external ear and ear canal normal. Decreased hearing is noted.  Nose: Nose normal.  Eyes: Conjunctivae are normal. Pupils are equal, round, and reactive to light.  Neck: Normal range of motion. Neck supple.  Cardiovascular: Normal rate, regular rhythm, normal heart sounds and intact distal pulses.  No murmur heard. Pulmonary/Chest: Effort normal and breath sounds normal. No respiratory distress. He has no wheezes. He has no rales. He exhibits no tenderness.  Abdominal: Soft. Bowel sounds are normal. He exhibits no distension and no mass. There is no tenderness. There is no rebound and no guarding.  Genitourinary: Testes normal.  Musculoskeletal: Normal range of motion.  Lymphadenopathy:    He has no cervical adenopathy.  Neurological: He is alert and oriented to person, place, and time.  Skin: Skin is warm and dry. No rash noted.  He is not diaphoretic. No erythema. No pallor.  Less of ruddy appearance then pervious OV  Psychiatric: He has a normal mood and affect. His behavior is normal. Judgment and thought content normal.  Nursing note and vitals reviewed.         Assessment & Plan:   1. Need for pneumococcal vaccination   2. Diabetes mellitus without complication ([place])   3. Essential hypertension   4. Hyperlipidemia associated with type 2 diabetes mellitus ([facility])     HTN (hypertension) BP at goal 115/76, HR 96 Continue Prinzide 10/12.44m daily He denies acute cardiac compliants  Hyperlipidemia associated with type 2 diabetes mellitus ([place] Lab Results  Component Value Date   HGBA1C 6.4 10/14/2017   HGBA1C 6.6 07/11/2017   HGBA1C 7.2 (H) 04/15/2017   Home AM BC 100-120s, no episodes of hypoglycemia Continue Metformin 503mBID     FOLLOW-UP:  Return in about 4 months (around 02/11/2018).

## 2017-12-03 LAB — HM DIABETES EYE EXAM

## 2018-02-07 ENCOUNTER — Other Ambulatory Visit: Payer: Self-pay

## 2018-02-07 MED ORDER — LISINOPRIL-HYDROCHLOROTHIAZIDE 10-12.5 MG PO TABS: 1.0000 | ORAL_TABLET | Freq: Every day | ORAL | 0 refills | Status: DC

## 2018-02-11 ENCOUNTER — Ambulatory Visit: Payer: Medicare Other | Admitting: Adult Health

## 2018-02-17 ENCOUNTER — Ambulatory Visit (INDEPENDENT_AMBULATORY_CARE_PROVIDER_SITE_OTHER): Payer: Medicare Other | Admitting: Adult Health

## 2018-02-17 ENCOUNTER — Encounter: Payer: Self-pay | Admitting: Adult Health

## 2018-02-17 VITALS — BP 156/77 | HR 97 | Ht 70.0 in | Wt 183.0 lb

## 2018-02-17 DIAGNOSIS — E785 Hyperlipidemia, unspecified: Secondary | ICD-10-CM

## 2018-02-17 DIAGNOSIS — Z0000 Encounter for general adult medical examination without abnormal findings: Secondary | ICD-10-CM

## 2018-02-17 DIAGNOSIS — E1169 Type 2 diabetes mellitus with other specified complication: Secondary | ICD-10-CM | POA: Diagnosis not present

## 2018-02-17 DIAGNOSIS — I10 Essential (primary) hypertension: Secondary | ICD-10-CM | POA: Diagnosis not present

## 2018-02-17 DIAGNOSIS — E119 Type 2 diabetes mellitus without complications: Secondary | ICD-10-CM | POA: Insufficient documentation

## 2018-02-17 LAB — POCT GLYCOSYLATED HEMOGLOBIN (HGB A1C): HEMOGLOBIN A1C: 5.9

## 2018-02-17 NOTE — Patient Instructions (Signed)
Diabetes Mellitus and Nutrition When you have diabetes (diabetes mellitus), it is very important to have healthy eating habits because your blood sugar (glucose) levels are greatly affected by what you eat and drink. Eating healthy foods in the appropriate amounts, at about the same times every day, can help you:  Control your blood glucose.  Lower your risk of heart disease.  Improve your blood pressure.  Reach or maintain a healthy weight.  Every person with diabetes is different, and each person has different needs for a meal plan. Your health care provider may recommend that you work with a diet and nutrition specialist (dietitian) to make a meal plan that is best for you. Your meal plan may vary depending on factors such as:  The calories you need.  The medicines you take.  Your weight.  Your blood glucose, blood pressure, and cholesterol levels.  Your activity level.  Other health conditions you have, such as heart or kidney disease.  How do carbohydrates affect me? Carbohydrates affect your blood glucose level more than any other type of food. Eating carbohydrates naturally increases the amount of glucose in your blood. Carbohydrate counting is a method for keeping track of how many carbohydrates you eat. Counting carbohydrates is important to keep your blood glucose at a healthy level, especially if you use insulin or take certain oral diabetes medicines. It is important to know how many carbohydrates you can safely have in each meal. This is different for every person. Your dietitian can help you calculate how many carbohydrates you should have at each meal and for snack. Foods that contain carbohydrates include:  Bread, cereal, rice, pasta, and crackers.  Potatoes and corn.  Peas, beans, and lentils.  Milk and yogurt.  Fruit and juice.  Desserts, such as cakes, cookies, ice cream, and candy.  How does alcohol affect me? Alcohol can cause a sudden decrease in blood  glucose (hypoglycemia), especially if you use insulin or take certain oral diabetes medicines. Hypoglycemia can be a life-threatening condition. Symptoms of hypoglycemia (sleepiness, dizziness, and confusion) are similar to symptoms of having too much alcohol. If your health care provider says that alcohol is safe for you, follow these guidelines:  Limit alcohol intake to no more than 1 drink per day for nonpregnant women and 2 drinks per day for men. One drink equals 12 oz of beer, 5 oz of wine, or 1 oz of hard liquor.  Do not drink on an empty stomach.  Keep yourself hydrated with water, diet soda, or unsweetened iced tea.  Keep in mind that regular soda, juice, and other mixers may contain a lot of sugar and must be counted as carbohydrates.  What are tips for following this plan? Reading food labels  Start by checking the serving size on the label. The amount of calories, carbohydrates, fats, and other nutrients listed on the label are based on one serving of the food. Many foods contain more than one serving per package.  Check the total grams (g) of carbohydrates in one serving. You can calculate the number of servings of carbohydrates in one serving by dividing the total carbohydrates by 15. For example, if a food has 30 g of total carbohydrates, it would be equal to 2 servings of carbohydrates.  Check the number of grams (g) of saturated and trans fats in one serving. Choose foods that have low or no amount of these fats.  Check the number of milligrams (mg) of sodium in one serving. Most people   should limit total sodium intake to less than 2,300 mg per day.  Always check the nutrition information of foods labeled as "low-fat" or "nonfat". These foods may be higher in added sugar or refined carbohydrates and should be avoided.  Talk to your dietitian to identify your daily goals for nutrients listed on the label. Shopping  Avoid buying canned, premade, or processed foods. These  foods tend to be high in fat, sodium, and added sugar.  Shop around the outside edge of the grocery store. This includes fresh fruits and vegetables, bulk grains, fresh meats, and fresh dairy. Cooking  Use low-heat cooking methods, such as baking, instead of high-heat cooking methods like deep frying.  Cook using healthy oils, such as olive, canola, or sunflower oil.  Avoid cooking with butter, cream, or high-fat meats. Meal planning  Eat meals and snacks regularly, preferably at the same times every day. Avoid going long periods of time without eating.  Eat foods high in fiber, such as fresh fruits, vegetables, beans, and whole grains. Talk to your dietitian about how many servings of carbohydrates you can eat at each meal.  Eat 4-6 ounces of lean protein each day, such as lean meat, chicken, fish, eggs, or tofu. 1 ounce is equal to 1 ounce of meat, chicken, or fish, 1 egg, or 1/4 cup of tofu.  Eat some foods each day that contain healthy fats, such as avocado, nuts, seeds, and fish. Lifestyle   Check your blood glucose regularly.  Exercise at least 30 minutes 5 or more days each week, or as told by your health care provider.  Take medicines as told by your health care provider.  Do not use any products that contain nicotine or tobacco, such as cigarettes and e-cigarettes. If you need help quitting, ask your health care provider.  Work with a Social worker or diabetes educator to identify strategies to manage stress and any emotional and social challenges. What are some questions to ask my health care provider?  Do I need to meet with a diabetes educator?  Do I need to meet with a dietitian?  What number can I call if I have questions?  When are the best times to check my blood glucose? Where to find more information:  American Diabetes Association: diabetes.org/food-and-fitness/food  Academy of Nutrition and Dietetics:  PokerClues.dk  [employer] of Diabetes and Digestive and Kidney Diseases (NIH): ContactWire.be Summary  A healthy meal plan will help you control your blood glucose and maintain a healthy lifestyle.  Working with a diet and nutrition specialist (dietitian) can help you make a meal plan that is best for you.  Keep in mind that carbohydrates and alcohol have immediate effects on your blood glucose levels. It is important to count carbohydrates and to use alcohol carefully. This information is not intended to replace advice given to you by your health care provider. Make sure you discuss any questions you have with your health care provider. Document Released: 06/14/2005 Document Revised: 10/22/2016 Document Reviewed: 10/22/2016 Elsevier Interactive Patient Education  2018 [employer].   Hypertension Hypertension, commonly called high blood pressure, is when the force of blood pumping through the arteries is too strong. The arteries are the blood vessels that carry blood from the heart throughout the body. Hypertension forces the heart to work harder to pump blood and may cause arteries to become narrow or stiff. Having untreated or uncontrolled hypertension can cause heart attacks, strokes, kidney disease, and other problems. A blood pressure reading consists of  a higher number over a lower number. Ideally, your blood pressure should be below 120/80. The first ("top") number is called the systolic pressure. It is a measure of the pressure in your arteries as your heart beats. The second ("bottom") number is called the diastolic pressure. It is a measure of the pressure in your arteries as the heart relaxes. What are the causes? The cause of this condition is not known. What increases the risk? Some risk factors for high blood pressure are under your control. Others  are not. Factors you can change  Smoking.  Having type 2 diabetes mellitus, high cholesterol, or both.  Not getting enough exercise or physical activity.  Being overweight.  Having too much fat, sugar, calories, or salt (sodium) in your diet.  Drinking too much alcohol. Factors that are difficult or impossible to change  Having chronic kidney disease.  Having a family history of high blood pressure.  Age. Risk increases with age.  Race. You may be at higher risk if you are African-American.  Gender. Men are at higher risk than women before age 64. After age 63, women are at higher risk than men.  Having obstructive sleep apnea.  Stress. What are the signs or symptoms? Extremely high blood pressure (hypertensive crisis) may cause:  Headache.  Anxiety.  Shortness of breath.  Nosebleed.  Nausea and vomiting.  Severe chest pain.  Jerky movements you cannot control (seizures).  How is this diagnosed? This condition is diagnosed by measuring your blood pressure while you are seated, with your arm resting on a surface. The cuff of the blood pressure monitor will be placed directly against the skin of your upper arm at the level of your heart. It should be measured at least twice using the same arm. Certain conditions can cause a difference in blood pressure between your right and left arms. Certain factors can cause blood pressure readings to be lower or higher than normal (elevated) for a short period of time:  When your blood pressure is higher when you are in a health care provider's office than when you are at home, this is called white coat hypertension. Most people with this condition do not need medicines.  When your blood pressure is higher at home than when you are in a health care provider's office, this is called masked hypertension. Most people with this condition may need medicines to control blood pressure.  If you have a high blood pressure reading during  one visit or you have normal blood pressure with other risk factors:  You may be asked to return on a different day to have your blood pressure checked again.  You may be asked to monitor your blood pressure at home for 1 week or longer.  If you are diagnosed with hypertension, you may have other blood or imaging tests to help your health care provider understand your overall risk for other conditions. How is this treated? This condition is treated by making healthy lifestyle changes, such as eating healthy foods, exercising more, and reducing your alcohol intake. Your health care provider may prescribe medicine if lifestyle changes are not enough to get your blood pressure under control, and if:  Your systolic blood pressure is above 130.  Your diastolic blood pressure is above 80.  Your personal target blood pressure may vary depending on your medical conditions, your age, and other factors. Follow these instructions at home: Eating and drinking  Eat a diet that is high in fiber and potassium,  and low in sodium, added sugar, and fat. An example eating plan is called the DASH (Dietary Approaches to Stop Hypertension) diet. To eat this way: ? Eat plenty of fresh fruits and vegetables. Try to fill half of your plate at each meal with fruits and vegetables. ? Eat whole grains, such as whole wheat pasta, brown rice, or whole grain bread. Fill about one quarter of your plate with whole grains. ? Eat or drink low-fat dairy products, such as skim milk or low-fat yogurt. ? Avoid fatty cuts of meat, processed or cured meats, and poultry with skin. Fill about one quarter of your plate with lean proteins, such as fish, chicken without skin, beans, eggs, and tofu. ? Avoid premade and processed foods. These tend to be higher in sodium, added sugar, and fat.  Reduce your daily sodium intake. Most people with hypertension should eat less than 1,500 mg of sodium a day.  Limit alcohol intake to no more  than 1 drink a day for nonpregnant women and 2 drinks a day for men. One drink equals 12 oz of beer, 5 oz of wine, or 1 oz of hard liquor. Lifestyle  Work with your health care provider to maintain a healthy body weight or to lose weight. Ask what an ideal weight is for you.  Get at least 30 minutes of exercise that causes your heart to beat faster (aerobic exercise) most days of the week. Activities may include walking, swimming, or biking.  Include exercise to strengthen your muscles (resistance exercise), such as pilates or lifting weights, as part of your weekly exercise routine. Try to do these types of exercises for 30 minutes at least 3 days a week.  Do not use any products that contain nicotine or tobacco, such as cigarettes and e-cigarettes. If you need help quitting, ask your health care provider.  Monitor your blood pressure at home as told by your health care provider.  Keep all follow-up visits as told by your health care provider. This is important. Medicines  Take over-the-counter and prescription medicines only as told by your health care provider. Follow directions carefully. Blood pressure medicines must be taken as prescribed.  Do not skip doses of blood pressure medicine. Doing this puts you at risk for problems and can make the medicine less effective.  Ask your health care provider about side effects or reactions to medicines that you should watch for. Contact a health care provider if:  You think you are having a reaction to a medicine you are taking.  You have headaches that keep coming back (recurring).  You feel dizzy.  You have swelling in your ankles.  You have trouble with your vision. Get help right away if:  You develop a severe headache or confusion.  You have unusual weakness or numbness.  You feel faint.  You have severe pain in your chest or abdomen.  You vomit repeatedly.  You have trouble breathing. Summary  Hypertension is when the  force of blood pumping through your arteries is too strong. If this condition is not controlled, it may put you at risk for serious complications.  Your personal target blood pressure may vary depending on your medical conditions, your age, and other factors. For most people, a normal blood pressure is less than 120/80.  Hypertension is treated with lifestyle changes, medicines, or a combination of both. Lifestyle changes include weight loss, eating a healthy, low-sodium diet, exercising more, and limiting alcohol. This information is not intended to replace  advice given to you by your health care provider. Make sure you discuss any questions you have with your health care provider. Document Released: 09/17/2005 Document Revised: 08/15/2016 Document Reviewed: 08/15/2016 Elsevier Interactive Patient Education  [employer].  Please continue medications as directed.  Follow Diabetic diet Increase regular exercise.  Recommend at least 30 minutes daily, 5 days per week of walking, jogging, biking, swimming, YouTube/Pinterest workout videos. Please follow-up 4 months, chronic medical conditions and fasting labs. NICE TO SEE YOU!

## 2018-02-17 NOTE — Assessment & Plan Note (Signed)
>>  ASSESSMENT AND PLAN FOR DIABETES [facility] ([place]) WRITTEN ON 02/17/2018  8:49 AM BY DANFORD, Valetta Fuller D, NP  Lab Results  Component Value Date   HGBA1C 5.9 02/17/2018   HGBA1C 6.4 10/14/2017   HGBA1C 6.6 07/11/2017   AM BS 110-120s Denies episodes of hypoglycemia Continue on Metformin 500mg - 2 tabs daily with meals

## 2018-02-17 NOTE — Assessment & Plan Note (Addendum)
BP slightly above goal 156/77 HR 97 Home BP consistently SBP 120-130 DBP 70-80 Continue Linsinopril/HCTZ 10/12.5mg  QD If elevated at next OV will consider increasing dosage

## 2018-02-17 NOTE — Assessment & Plan Note (Signed)
Please continue medications as directed.  Follow Diabetic diet Increase regular exercise.  Recommend at least 30 minutes daily, 5 days per week of walking, jogging, biking, swimming, YouTube/Pinterest workout videos. Please follow-up 4 months, chronic medical conditions and fasting labs.

## 2018-02-17 NOTE — Assessment & Plan Note (Signed)
Lab Results  Component Value Date   HGBA1C 5.9 02/17/2018   HGBA1C 6.4 10/14/2017   HGBA1C 6.6 07/11/2017   AM BS 110-120s Denies episodes of hypoglycemia Continue on Metformin 500mg - 2 tabs daily with meals

## 2018-02-17 NOTE — Progress Notes (Signed)
Subjective:    Patient ID: Jason [name], male    DOB: 12/19/1945, 72 y.o.   MRN: [id number] 06/12/2017 OV: Jason [name] is here for f/u: HTN, T2D, he is ultimately seeking medical clearance cataract surgery. He has been compliant on antihypertensives, Metformin, and statin.  He denies SE and reports that AM BS running 100-120s, he denies episodes of hypoglycemia.  He walks >69mle and day and works in the yard most days of the week.   He has been reducing saturated fat/CHO from diet and has restricted EOTH use to weekends only. He states " I feel better now that I have in years"-GREAT!   07/11/2017 OV: Mr. MBurdetteis here for CPE.  He reports compliancy on medications and denies SE. He has been continuing to reduce saturated fat/sugar/CHO/ETOH use.  He moves on regular basis- yard work and walking.  Overall he continues to feel "pretty darn great". Healthcare maintenance: Colonoscopy due 2010 10 pack/yr hx- denies ever having AAA screening Immunizations UTD  10/14/17 OV: Jason [name] here for f/u: HTN,T2D,  Her reports medication compliance and denies SE. Home AM BS 100-110s, he denies hypoglycemia.  He eats a diabetic diet and has completley eliminated sugar-GREAT! He continues to walk daily and denies CP/dyspnea/palpiations. He continues to abstain from tobacco and drastically reduced ETOH use, most days none and when he does enjoy a cocktail he never has more than 2. Overall he is feeling "much better than a few months ago". A1c 6.4 today-GREAT!!  02/17/18 OV: Jason [name] here for regular f/u: HTN, HLD, T2D He reports medication compliance and denies SE He reports AM BS 110-120s, denies episodes of hypoglycemia  He has been trying to increase daily, reports "I have slacked off a bit recently" He has continue to keep ETOH use to min, estimates 3-4 drinks over entire weekend He continues to abstain from tobacco  Lab Results  Component Value Date   HGBA1C 5.9  02/17/2018   HGBA1C 6.4 10/14/2017   HGBA1C 6.6 07/11/2017   Patient Care Team    Relationship Specialty Notifications Start End  DMina MarbleD, NP PCP - General Family Medicine  04/15/17   MBarbaraann Cao O[place]hill[place]erring Physician Optometry  06/12/17     Patient Active Problem List   Diagnosis Date Noted  . Diabetes mellitus without complication ([place] 012/[id number]/[id number] . Hyperlipidemia associated with type 2 diabetes mellitus ([place] 05/16/2017  . Type 2 diabetes mellitus ([place] 05/16/2017  . HTN (hypertension) 04/15/2017  . Routine health maintenance 04/15/2017  . Fatigue 04/15/2017     Past Medical History:  Diagnosis Date  . Cataract   . [place] mellitus without complication ([place]   . Hypertension   . Wrist injury      Past Surgical History:  Procedure Laterality Date  . APPENDECTOMY    . CATARACT EXTRACTION       Family History  Problem Relation Age of Onset  . Stroke Mother   . Arthritis Father      Social History   Substance and Sexual Activity  Drug Use No     Social History   Substance and Sexual Activity  Alcohol Use Yes  . Alcohol/week: 2.4 oz  . Types: 4 Cans of beer per week     Social History   Tobacco Use  Smoking Status Never Smoker  Smokeless Tobacco Never Used     Outpatient Encounter Medications as of 02/17/2018  Medication Sig  . atorvastatin (LIPITOR) 10 MG tablet Take  1 tablet (10 mg total) by mouth daily.  . blood glucose meter kit and supplies Please dispense One Touch Ultra Kit for use to check fasting blood sugars once daily  . lisinopril-hydrochlorothiazide (PRINZIDE,ZESTORETIC) 10-12.5 MG tablet Take 1 tablet by mouth daily.  . metFORMIN (GLUCOPHAGE) 500 MG tablet Take 1 tablet (500 mg total) by mouth 2 (two) times daily with a meal. Take one tablet by mouth with dinner for the first two weeks, then increase to one tab with breakfast and one tab with dinner  . ONE TOUCH ULTRA TEST test strip USE TO CHECK BLOOD GLUCOSE   FASTING ONCE DAILY  . ONETOUCH DELICA LANCETS 93Y MISC USE TO CHECK BLOOD GLUCOSE  FASTING ONCE DAILY   No facility-administered encounter medications on file as of 02/17/2018.     Allergies: Patient has no known allergies.  Body mass index is 26.26 kg/m.  Blood pressure (!) 156/77, pulse 97, height _0  (1.778 m), weight 183 lb (83 kg), SpO2 98 %.  Review of Systems  Constitutional: Positive for fatigue. Negative for activity change, appetite change, chills, diaphoresis, fever and unexpected weight change.  HENT: Negative for congestion.   Respiratory: Negative for cough, chest tightness, shortness of breath, wheezing and stridor.   Cardiovascular: Negative for chest pain, palpitations and leg swelling.  Gastrointestinal: Negative for abdominal distention, abdominal pain, blood in stool, constipation, diarrhea, nausea and vomiting.  Endocrine: Negative for cold intolerance, heat intolerance, polydipsia, polyphagia and polyuria.  Genitourinary: Negative for difficulty urinating and flank pain.  Neurological: Negative for dizziness and headaches.  Hematological: Does not bruise/bleed easily.  Psychiatric/Behavioral: Negative for sleep disturbance.       Objective:   Physical Exam  Constitutional: He is oriented to person, place, and time. He appears well-developed and well-nourished. No distress.  HENT:  Head: Normocephalic.  Right Ear: Tympanic membrane, external ear and ear canal normal. Decreased hearing is noted.  Left Ear: Tympanic membrane, external ear and ear canal normal. Decreased hearing is noted.  Nose: Nose normal.  Eyes: Pupils are equal, round, and reactive to light. Conjunctivae are normal.  Neck: Normal range of motion. Neck supple.  Cardiovascular: Normal rate, regular rhythm, normal heart sounds and intact distal pulses.  No murmur heard. Pulmonary/Chest: Effort normal and breath sounds normal. No respiratory distress. He has no wheezes. He has no rales. He  exhibits no tenderness.  Abdominal: Soft. Bowel sounds are normal. He exhibits no distension and no mass. There is no tenderness. There is no rebound and no guarding.  Genitourinary: Testes normal.  Musculoskeletal: Normal range of motion.  Lymphadenopathy:    He has no cervical adenopathy.  Neurological: He is alert and oriented to person, place, and time.  Skin: Skin is warm and dry. No rash noted. He is not diaphoretic. No erythema. No pallor.  Less of ruddy appearance then pervious OV  Psychiatric: He has a normal mood and affect. His behavior is normal. Judgment and thought content normal.  Nursing note and vitals reviewed.     Assessment & Plan:   1. Diabetes mellitus without complication ([place])   2. Routine health maintenance   3. Essential hypertension   4. Hyperlipidemia associated with type 2 diabetes mellitus ([place])     Diabetes mellitus without complication ([address]) Lab Results  Component Value Date   HGBA1C 5.9 02/17/2018   HGBA1C 6.4 10/14/2017   HGBA1C 6.6 07/11/2017   AM BS 110-120s Denies episodes of hypoglycemia Continue on Metformin 544m- 2 tabs daily with meals  HTN (hypertension) BP slightly above goal 156/77 HR 97 Home BP consistently SBP 120-130 DBP 70-80 Continue Linsinopril/HCTZ 10/12.15m QD If elevated at next OV will consider increasing dosage  Routine health maintenance Please continue medications as directed.  Follow Diabetic diet Increase regular exercise.  Recommend at least 30 minutes daily, 5 days per week of walking, jogging, biking, swimming, YouTube/Pinterest workout videos. Please follow-up 4 months, chronic medical conditions and fasting labs.  FOLLOW-UP:  Return for Regular Follow Up, HTN, Diabetes.

## 2018-02-20 ENCOUNTER — Telehealth: Payer: Self-pay | Admitting: Adult Health

## 2018-02-20 NOTE — Telephone Encounter (Signed)
LVM for pt to return call.  T. Nelson, CMA 

## 2018-02-20 NOTE — Telephone Encounter (Signed)
LVM for pt to return call to discuss.  Charyl Bigger, CMA

## 2018-02-20 NOTE — Telephone Encounter (Signed)
Patient got call from [facility] about a medication (he can't recall what med they were talking about), but that they had tried contacting us via fax about getting a med approved from our office to be shipped to patient but have not heard back. He seemed a little confused about it all so that's why he called to make sure everything was ok and that he didn't need to do anything on his end.

## 2018-02-21 NOTE — Telephone Encounter (Signed)
Advised pt that I will need information on which medication that they need refills sent for.  Pt expressed understanding and will call back with this information.  Jason [name], CMA

## 2018-03-28 ENCOUNTER — Other Ambulatory Visit: Payer: Self-pay | Admitting: Adult Health

## 2018-04-11 ENCOUNTER — Other Ambulatory Visit: Payer: Self-pay | Admitting: Adult Health

## 2018-04-18 ENCOUNTER — Other Ambulatory Visit: Payer: Self-pay

## 2018-04-18 MED ORDER — LISINOPRIL-HYDROCHLOROTHIAZIDE 10-12.5 MG PO TABS: 1.0000 | ORAL_TABLET | Freq: Every day | ORAL | 0 refills | Status: DC

## 2018-04-18 NOTE — Telephone Encounter (Signed)
Received RF request from [facility] for lisinopril 10mg .  Advised pharmacy that pt is not taking plain lisinopril and is currently on lisinopril-HCTZ.  RF sent to pharmacy with documentation informing them that pt is no longer on this medication.  Charyl Bigger, CMA

## 2018-05-05 ENCOUNTER — Other Ambulatory Visit: Payer: Self-pay | Admitting: Adult Health

## 2018-06-19 NOTE — Progress Notes (Signed)
Subjective:    Patient ID: Jason [name], male    DOB: 04-12-1946, 72 y.o.   MRN: [id number] 06/12/2017 OV: Mr. Plack is here for f/u: HTN, T2D, he is ultimately seeking medical clearance cataract surgery. He has been compliant on antihypertensives, Metformin, and statin.  He denies SE and reports that AM BS running 100-120s, he denies episodes of hypoglycemia.  He walks >37mle and day and works in the yard most days of the week.   He has been reducing saturated fat/CHO from diet and has restricted EOTH use to weekends only. He states " I feel better now that I have in years"-GREAT!   07/11/2017 OV: Mr. MCarricois here for CPE.  He reports compliancy on medications and denies SE. He has been continuing to reduce saturated fat/sugar/CHO/ETOH use.  He moves on regular basis- yard work and walking.  Overall he continues to feel "pretty darn great". Healthcare maintenance: Colonoscopy due 2010 10 pack/yr hx- denies ever having AAA screening Immunizations UTD  10/14/17 OV: Mr. MYuis here for f/u: HTN,T2D,  Her reports medication compliance and denies SE. Home AM BS 100-110s, he denies hypoglycemia.  He eats a diabetic diet and has completley eliminated sugar-GREAT! He continues to walk daily and denies CP/dyspnea/palpiations. He continues to abstain from tobacco and drastically reduced ETOH use, most days none and when he does enjoy a cocktail he never has more than 2. Overall he is feeling "much better than a few months ago". A1c 6.4 today-GREAT!!  02/17/18 OV: Mr. MKennanis here for regular f/u: HTN, HLD, T2D He reports medication compliance and denies SE He reports AM BS 110-120s, denies episodes of hypoglycemia  He has been trying to increase daily, reports "I have slacked off a bit recently" He has continue to keep ETOH use to min, estimates 3-4 drinks over entire weekend He continues to abstain from tobacco  06/24/18 OV: Ms. MRedderis here for f/u:    Lab Results  Component Value Date   HGBA1C 5.7 (A) 06/24/2018   HGBA1C 5.9 02/17/2018   HGBA1C 6.4 10/14/2017   He reports medication compliance, denies SE He has not been checking his BS, however denies feelings of highs or lows He walks >1 mile 4 days a week, reduced Na++ snacks and avoid sugar altogether  He he stopped drinking alcohol on weekdays, will only have 2-3 cocktails sat/sun He has lost > 9 lbs since last OV May 2019 He denies CP/dyspnea/dizziness/HA/palpitations Overall he feels "pretty darn great" He had successful OD cataract removal He is fasting today  Patient Care Team    Relationship Specialty Notifications Start End  DMina MarbleD, NP PCP - General Family Medicine  04/15/17   MBarbaraann Cao ONew HollandReferring Physician Optometry  06/12/17     Patient Active Problem List   Diagnosis Date Noted  . Diabetes mellitus without complication ([place] 093/26/7124 . Hyperlipidemia associated with type 2 diabetes mellitus ([place] 05/16/2017  . Type 2 diabetes mellitus ([facility] 05/16/2017  . HTN (hypertension) 04/15/2017  . Routine health maintenance 04/15/2017  . Fatigue 04/15/2017     Past Medical History:  Diagnosis Date  . Cataract   . Diabetes mellitus without complication ([facility]   . Hypertension   . Wrist injury      Past Surgical History:  Procedure Laterality Date  . APPENDECTOMY    . CATARACT EXTRACTION       Family History  Problem Relation Age of Onset  . Stroke Mother   .  Arthritis Father      Social History   Substance and Sexual Activity  Drug Use No     Social History   Substance and Sexual Activity  Alcohol Use Yes  . Alcohol/week: 4.0 standard drinks  . Types: 4 Cans of beer per week     Social History   Tobacco Use  Smoking Status Never Smoker  Smokeless Tobacco Never Used     Outpatient Encounter Medications as of 06/24/2018  Medication Sig  . atorvastatin (LIPITOR) 10 MG tablet TAKE 1 TABLET BY MOUTH EVERY DAY   . blood glucose meter kit and supplies Please dispense One Touch Ultra Kit for use to check fasting blood sugars once daily  . lisinopril-hydrochlorothiazide (PRINZIDE,ZESTORETIC) 10-12.5 MG tablet Take 1 tablet by mouth daily.  . metFORMIN (GLUCOPHAGE) 500 MG tablet Take 1 tablet (500 mg total) by mouth daily with breakfast.  . ONE TOUCH ULTRA TEST test strip USE TO CHECK BLOOD GLUCOSE  FASTING ONCE DAILY  . ONETOUCH DELICA LANCETS 47Q MISC USE TO CHECK BLOOD GLUCOSE  FASTING ONCE DAILY  . [DISCONTINUED] metFORMIN (GLUCOPHAGE) 500 MG tablet Take 1 tablet (500 mg total) by mouth 2 (two) times daily with a meal.   No facility-administered encounter medications on file as of 06/24/2018.     Allergies: Patient has no known allergies.  Body mass index is 25.02 kg/m.  Blood pressure (!) 145/81, pulse 96, height '5\' 10"'  (1.778 m), weight 174 lb 6.4 oz (79.1 kg), SpO2 97 %.  Review of Systems  Constitutional: Positive for fatigue. Negative for activity change, appetite change, chills, diaphoresis, fever and unexpected weight change.  HENT: Negative for congestion.   Respiratory: Negative for cough, chest tightness, shortness of breath, wheezing and stridor.   Cardiovascular: Negative for chest pain, palpitations and leg swelling.  Gastrointestinal: Negative for abdominal distention, abdominal pain, blood in stool, constipation, diarrhea, nausea and vomiting.  Endocrine: Negative for cold intolerance, heat intolerance, polydipsia, polyphagia and polyuria.  Genitourinary: Negative for difficulty urinating and flank pain.  Neurological: Negative for dizziness and headaches.  Hematological: Does not bruise/bleed easily.  Psychiatric/Behavioral: Negative for sleep disturbance.       Objective:   Physical Exam  Constitutional: He is oriented to person, place, and time. He appears well-developed and well-nourished. No distress.  HENT:  Head: Normocephalic.  Right Ear: Tympanic membrane,  external ear and ear canal normal. Decreased hearing is noted.  Left Ear: Tympanic membrane, external ear and ear canal normal. Decreased hearing is noted.  Nose: Nose normal.  Eyes: Pupils are equal, round, and reactive to light. Conjunctivae are normal.  Neck: Normal range of motion. Neck supple.  Cardiovascular: Normal rate, regular rhythm, normal heart sounds and intact distal pulses.  No murmur heard. Pulmonary/Chest: Effort normal and breath sounds normal. No respiratory distress. He has no wheezes. He has no rales. He exhibits no tenderness.  Abdominal: Soft. Bowel sounds are normal. He exhibits no distension and no mass. There is no tenderness. There is no rebound and no guarding.  Genitourinary: Testes normal.  Musculoskeletal: Normal range of motion.  Lymphadenopathy:    He has no cervical adenopathy.  Neurological: He is alert and oriented to person, place, and time. He displays tremor.  Skin: Skin is warm and dry. No rash noted. He is not diaphoretic. No erythema. No pallor.  Less of ruddy appearance then pervious OV  Psychiatric: He has a normal mood and affect. His behavior is normal. Judgment and thought content normal.  Nursing  note and vitals reviewed.     Assessment & Plan:   1. Diabetes mellitus without complication ([place])   2. Routine health maintenance   3. Hyperlipidemia associated with type 2 diabetes mellitus ([place])   4. Essential hypertension   5. Type 2 diabetes mellitus with complication, without long-term current use of insulin (HCC)     HTN (hypertension) BP slightly elevated 145/81, HR96 Continue Prinzide 10/12.19m QD and regular exercise He has lost 9 lbs since last OV May 2019 Continue to reduce ETOH use  Routine health maintenance Continue all medications as directed, with one change- Reduce Metformin 5023mfrom twice daily to just once daily. Please check fasting am blood sugar several times a week. Continue to drink plenty of water and follow  diabetic diet. Continue regular walking and limit alcohol intake. We will call you when lab results are available. Follow-up in 3 months for complete physical, last physical was Ot 2018.  Type 2 diabetes mellitus (HCC) Lab Results  Component Value Date   HGBA1C 5.7 (A) 06/24/2018   HGBA1C 5.9 02/17/2018   HGBA1C 6.4 10/14/2017   Reduce Metformin 50036mrom twice daily to just once daily. Please check fasting am blood sugar several times a week.   FOLLOW-UP:  Return in about 3 months (around 09/23/2018) for CPE.

## 2018-06-24 ENCOUNTER — Encounter: Payer: Self-pay | Admitting: Adult Health

## 2018-06-24 ENCOUNTER — Ambulatory Visit (INDEPENDENT_AMBULATORY_CARE_PROVIDER_SITE_OTHER): Payer: Medicare Other | Admitting: Adult Health

## 2018-06-24 VITALS — BP 145/81 | HR 96 | Ht 70.0 in | Wt 174.4 lb

## 2018-06-24 DIAGNOSIS — E118 Type 2 diabetes mellitus with unspecified complications: Secondary | ICD-10-CM

## 2018-06-24 DIAGNOSIS — E1169 Type 2 diabetes mellitus with other specified complication: Secondary | ICD-10-CM

## 2018-06-24 DIAGNOSIS — I10 Essential (primary) hypertension: Secondary | ICD-10-CM

## 2018-06-24 DIAGNOSIS — E785 Hyperlipidemia, unspecified: Secondary | ICD-10-CM

## 2018-06-24 DIAGNOSIS — Z0000 Encounter for general adult medical examination without abnormal findings: Secondary | ICD-10-CM

## 2018-06-24 DIAGNOSIS — E119 Type 2 diabetes mellitus without complications: Secondary | ICD-10-CM

## 2018-06-24 LAB — POCT GLYCOSYLATED HEMOGLOBIN (HGB A1C): Hemoglobin A1C: 5.7 % — AB (ref 4.0–5.6)

## 2018-06-24 MED ORDER — METFORMIN HCL 500 MG PO TABS: 500.0000 mg | ORAL_TABLET | Freq: Every day | ORAL | 1 refills | Status: DC

## 2018-06-24 NOTE — Patient Instructions (Addendum)
Diabetes Mellitus and Nutrition When you have diabetes (diabetes mellitus), it is very important to have healthy eating habits because your blood sugar (glucose) levels are greatly affected by what you eat and drink. Eating healthy foods in the appropriate amounts, at about the same times every day, can help you:  Control your blood glucose.  Lower your risk of heart disease.  Improve your blood pressure.  Reach or maintain a healthy weight.  Every person with diabetes is different, and each person has different needs for a meal plan. Your health care provider may recommend that you work with a diet and nutrition specialist (dietitian) to make a meal plan that is best for you. Your meal plan may vary depending on factors such as:  The calories you need.  The medicines you take.  Your weight.  Your blood glucose, blood pressure, and cholesterol levels.  Your activity level.  Other health conditions you have, such as heart or kidney disease.  How do carbohydrates affect me? Carbohydrates affect your blood glucose level more than any other type of food. Eating carbohydrates naturally increases the amount of glucose in your blood. Carbohydrate counting is a method for keeping track of how many carbohydrates you eat. Counting carbohydrates is important to keep your blood glucose at a healthy level, especially if you use insulin or take certain oral diabetes medicines. It is important to know how many carbohydrates you can safely have in each meal. This is different for every person. Your dietitian can help you calculate how many carbohydrates you should have at each meal and for snack. Foods that contain carbohydrates include:  Bread, cereal, rice, pasta, and crackers.  Potatoes and corn.  Peas, beans, and lentils.  Milk and yogurt.  Fruit and juice.  Desserts, such as cakes, cookies, ice cream, and candy.  How does alcohol affect me? Alcohol can cause a sudden decrease in blood  glucose (hypoglycemia), especially if you use insulin or take certain oral diabetes medicines. Hypoglycemia can be a life-threatening condition. Symptoms of hypoglycemia (sleepiness, dizziness, and confusion) are similar to symptoms of having too much alcohol. If your health care provider says that alcohol is safe for you, follow these guidelines:  Limit alcohol intake to no more than 1 drink per day for nonpregnant women and 2 drinks per day for men. One drink equals 12 oz of beer, 5 oz of wine, or 1 oz of hard liquor.  Do not drink on an empty stomach.  Keep yourself hydrated with water, diet soda, or unsweetened iced tea.  Keep in mind that regular soda, juice, and other mixers may contain a lot of sugar and must be counted as carbohydrates.  What are tips for following this plan? Reading food labels  Start by checking the serving size on the label. The amount of calories, carbohydrates, fats, and other nutrients listed on the label are based on one serving of the food. Many foods contain more than one serving per package.  Check the total grams (g) of carbohydrates in one serving. You can calculate the number of servings of carbohydrates in one serving by dividing the total carbohydrates by 15. For example, if a food has 30 g of total carbohydrates, it would be equal to 2 servings of carbohydrates.  Check the number of grams (g) of saturated and trans fats in one serving. Choose foods that have low or no amount of these fats.  Check the number of milligrams (mg) of sodium in one serving. Most people   should limit total sodium intake to less than 2,300 mg per day.  Always check the nutrition information of foods labeled as "low-fat" or "nonfat". These foods may be higher in added sugar or refined carbohydrates and should be avoided.  Talk to your dietitian to identify your daily goals for nutrients listed on the label. Shopping  Avoid buying canned, premade, or processed foods. These  foods tend to be high in fat, sodium, and added sugar.  Shop around the outside edge of the grocery store. This includes fresh fruits and vegetables, bulk grains, fresh meats, and fresh dairy. Cooking  Use low-heat cooking methods, such as baking, instead of high-heat cooking methods like deep frying.  Cook using healthy oils, such as olive, canola, or sunflower oil.  Avoid cooking with butter, cream, or high-fat meats. Meal planning  Eat meals and snacks regularly, preferably at the same times every day. Avoid going long periods of time without eating.  Eat foods high in fiber, such as fresh fruits, vegetables, beans, and whole grains. Talk to your dietitian about how many servings of carbohydrates you can eat at each meal.  Eat 4-6 ounces of lean protein each day, such as lean meat, chicken, fish, eggs, or tofu. 1 ounce is equal to 1 ounce of meat, chicken, or fish, 1 egg, or 1/4 cup of tofu.  Eat some foods each day that contain healthy fats, such as avocado, nuts, seeds, and fish. Lifestyle   Check your blood glucose regularly.  Exercise at least 30 minutes 5 or more days each week, or as told by your health care provider.  Take medicines as told by your health care provider.  Do not use any products that contain nicotine or tobacco, such as cigarettes and e-cigarettes. If you need help quitting, ask your health care provider.  Work with a Social worker or diabetes educator to identify strategies to manage stress and any emotional and social challenges. What are some questions to ask my health care provider?  Do I need to meet with a diabetes educator?  Do I need to meet with a dietitian?  What number can I call if I have questions?  When are the best times to check my blood glucose? Where to find more information:  American Diabetes Association: diabetes.org/food-and-fitness/food  Academy of Nutrition and Dietetics:  PokerClues.dk  [employer] of Diabetes and Digestive and Kidney Diseases (NIH): ContactWire.be Summary  A healthy meal plan will help you control your blood glucose and maintain a healthy lifestyle.  Working with a diet and nutrition specialist (dietitian) can help you make a meal plan that is best for you.  Keep in mind that carbohydrates and alcohol have immediate effects on your blood glucose levels. It is important to count carbohydrates and to use alcohol carefully. This information is not intended to replace advice given to you by your health care provider. Make sure you discuss any questions you have with your health care provider. Document Released: 06/14/2005 Document Revised: 10/22/2016 Document Reviewed: 10/22/2016 Elsevier Interactive Patient Education  2018 [place] all medications as directed, with one change- Reduce Metformin 500mg  from twice daily to just once daily. Please check fasting am blood sugar several times a week. Continue to drink plenty of water and follow diabetic diet. Continue regular walking and limit alcohol intake. We will call you when lab results are available. Follow-up in 3 months for complete physical, last physical was Ot 2018. OVERALL YOU ARE DOING GREAT! NICE TO SEE YOU!

## 2018-06-24 NOTE — Assessment & Plan Note (Signed)
BP slightly elevated 145/81, HR96 Continue Prinzide 10/12.5mg  QD and regular exercise He has lost 9 lbs since last OV May 2019 Continue to reduce ETOH use

## 2018-06-24 NOTE — Assessment & Plan Note (Signed)
Lab Results  Component Value Date   HGBA1C 5.7 (A) 06/24/2018   HGBA1C 5.9 02/17/2018   HGBA1C 6.4 10/14/2017   Reduce Metformin 500mg  from twice daily to just once daily. Please check fasting am blood sugar several times a week.

## 2018-06-24 NOTE — Assessment & Plan Note (Signed)
Continue all medications as directed, with one change- Reduce Metformin 500mg  from twice daily to just once daily. Please check fasting am blood sugar several times a week. Continue to drink plenty of water and follow diabetic diet. Continue regular walking and limit alcohol intake. We will call you when lab results are available. Follow-up in 3 months for complete physical, last physical was Ot 2018.

## 2018-06-25 ENCOUNTER — Other Ambulatory Visit: Payer: Self-pay | Admitting: Adult Health

## 2018-06-25 DIAGNOSIS — N183 Chronic kidney disease, stage 3 unspecified: Secondary | ICD-10-CM

## 2018-06-25 LAB — CBC WITH DIFFERENTIAL/PLATELET
Basophils Absolute: 0 10*3/uL (ref 0.0–0.2)
Basos: 0 %
EOS (ABSOLUTE): 0.1 10*3/uL (ref 0.0–0.4)
EOS: 1 %
Hematocrit: 40.1 % (ref 37.5–51.0)
Hemoglobin: 14 g/dL (ref 13.0–17.7)
IMMATURE GRANS (ABS): 0 10*3/uL (ref 0.0–0.1)
IMMATURE GRANULOCYTES: 0 %
LYMPHS: 15 %
Lymphocytes Absolute: 1.2 10*3/uL (ref 0.7–3.1)
MCH: 36.6 pg — ABNORMAL HIGH (ref 26.6–33.0)
MCHC: 34.9 g/dL (ref 31.5–35.7)
MCV: 105 fL — ABNORMAL HIGH (ref 79–97)
MONOS ABS: 0.9 10*3/uL (ref 0.1–0.9)
Monocytes: 11 %
NEUTROS PCT: 73 %
Neutrophils Absolute: 5.5 10*3/uL (ref 1.4–7.0)
PLATELETS: 188 10*3/uL (ref 150–450)
RBC: 3.83 x10E6/uL — AB (ref 4.14–5.80)
RDW: 12.9 % (ref 12.3–15.4)
WBC: 7.6 10*3/uL (ref 3.4–10.8)

## 2018-06-25 LAB — LIPID PANEL
CHOLESTEROL TOTAL: 135 mg/dL (ref 100–199)
Chol/HDL Ratio: 2 ratio (ref 0.0–5.0)
HDL: 69 mg/dL (ref >39)
LDL Calculated: 52 mg/dL (ref 0–99)
Triglycerides: 72 mg/dL (ref 0–149)
VLDL Cholesterol Cal: 14 mg/dL (ref 5–40)

## 2018-06-25 LAB — COMPREHENSIVE METABOLIC PANEL
ALK PHOS: 69 IU/L (ref 39–117)
ALT: 53 IU/L — ABNORMAL HIGH (ref 0–44)
AST: 39 IU/L (ref 0–40)
Albumin/Globulin Ratio: 2.3 — ABNORMAL HIGH (ref 1.2–2.2)
Albumin: 4.9 g/dL — ABNORMAL HIGH (ref 3.6–4.8)
BUN/Creatinine Ratio: 15 (ref 10–24)
BUN: 24 mg/dL (ref 8–27)
Bilirubin Total: 1.1 mg/dL (ref 0.0–1.2)
CALCIUM: 10.2 mg/dL (ref 8.6–10.2)
CO2: 19 mmol/L — AB (ref 20–29)
CREATININE: 1.65 mg/dL — AB (ref 0.76–1.27)
Chloride: 84 mmol/L — ABNORMAL LOW (ref 96–106)
GFR calc Af Amer: 48 mL/min/{1.73_m2} — ABNORMAL LOW (ref >59)
GFR, EST NON AFRICAN AMERICAN: 42 mL/min/{1.73_m2} — AB (ref >59)
GLOBULIN, TOTAL: 2.1 g/dL (ref 1.5–4.5)
GLUCOSE: 237 mg/dL — AB (ref 65–99)
POTASSIUM: 4.8 mmol/L (ref 3.5–5.2)
SODIUM: 131 mmol/L — AB (ref 134–144)
Total Protein: 7 g/dL (ref 6.0–8.5)

## 2018-06-25 LAB — HEMOGLOBIN A1C
Est. average glucose Bld gHb Est-mCnc: 117 mg/dL
Hgb A1c MFr Bld: 5.7 % — ABNORMAL HIGH (ref 4.8–5.6)

## 2018-06-25 LAB — TSH: TSH: 1.93 u[IU]/mL (ref 0.450–4.500)

## 2018-06-25 MED ORDER — ATORVASTATIN CALCIUM 10 MG PO TABS: ORAL_TABLET | ORAL | 1 refills | Status: DC

## 2018-06-25 MED ORDER — METFORMIN HCL 500 MG PO TABS: ORAL_TABLET | ORAL | 1 refills | Status: DC

## 2018-07-13 ENCOUNTER — Other Ambulatory Visit: Payer: Self-pay | Admitting: Adult Health

## 2018-07-16 ENCOUNTER — Other Ambulatory Visit: Payer: Self-pay | Admitting: Adult Health

## 2018-07-17 ENCOUNTER — Other Ambulatory Visit: Payer: Self-pay

## 2018-07-17 ENCOUNTER — Telehealth: Payer: Self-pay | Admitting: Adult Health

## 2018-07-17 DIAGNOSIS — E1122 Type 2 diabetes mellitus with diabetic chronic kidney disease: Secondary | ICD-10-CM

## 2018-07-17 DIAGNOSIS — Z794 Long term (current) use of insulin: Principal | ICD-10-CM

## 2018-07-17 NOTE — Telephone Encounter (Signed)
Patient was called by his pharm that a refill request was denied. He is not sure which med was denied so I went through his meds and advised him all of his meds have been refilled within the last few weeks (except his diabetic testing supplies which patient states he has plenty of). As we were going through his meds some state "take half tab" but patient's bottles state "full tab". Patient would like a call back to make sure that:  1. He has the meds he needs and that denied refill was just an error  2. That he can verify his dosage of meds to make sure he is taking them accurately  Please advise

## 2018-07-17 NOTE — Telephone Encounter (Signed)
Confirmed correct dosage of metformin and atorvastatin had been decreased to 1/2 daily d/t his recent labs.  Advised pt that when he needs his RXs refilled, he should call pharmacy and they will contact us.  Pt expressed understanding and is agreeable.  Charyl Bigger, CMA

## 2018-07-21 DIAGNOSIS — I10 Essential (primary) hypertension: Secondary | ICD-10-CM | POA: Insufficient documentation

## 2018-07-21 DIAGNOSIS — N179 Acute kidney failure, unspecified: Secondary | ICD-10-CM | POA: Insufficient documentation

## 2018-07-24 ENCOUNTER — Telehealth: Payer: Self-pay | Admitting: Adult Health

## 2018-07-24 NOTE — Telephone Encounter (Signed)
Patient cld says was told to check with Ins [employer] to see which diabetic monitor they cover (he says doctor to recommend) which monitor .  --Please send to:    [id number] [address], [place]. [phone] (Phone) [phone] (Fax)   --Forwarding message to medical assistant  & to contact patient if any questions or concerns @ [phone].  --glh

## 2018-07-25 MED ORDER — BLOOD PRESSURE KIT: PACK | 0 refills | Status: AC

## 2018-07-25 MED ORDER — ACCU-CHEK SOFTCLIX LANCET DEV KIT: PACK | 0 refills | Status: AC

## 2018-07-25 MED ORDER — ACCU-CHEK SOFT TOUCH LANCETS MISC: 12 refills | Status: AC

## 2018-07-25 MED ORDER — GLUCOSE BLOOD VI STRP: ORAL_STRIP | 12 refills | Status: AC

## 2018-07-25 MED ORDER — ACCU-CHEK AVIVA PLUS W/DEVICE KIT: PACK | 0 refills | Status: AC

## 2018-07-25 NOTE — Telephone Encounter (Signed)
Blood glucose testing supplies and blood pressure monitor [facility]s [facility] [address]  Pt informed.  Charyl Bigger, CMA

## 2018-09-09 ENCOUNTER — Encounter: Payer: Self-pay | Admitting: Adult Health

## 2018-09-09 ENCOUNTER — Ambulatory Visit (INDEPENDENT_AMBULATORY_CARE_PROVIDER_SITE_OTHER): Payer: Medicare Other | Admitting: Adult Health

## 2018-09-09 VITALS — BP 145/80 | HR 96 | Temp 98.5°F | Ht 70.0 in | Wt 172.6 lb

## 2018-09-09 DIAGNOSIS — Z1211 Encounter for screening for malignant neoplasm of colon: Secondary | ICD-10-CM | POA: Diagnosis not present

## 2018-09-09 DIAGNOSIS — N183 Chronic kidney disease, stage 3 unspecified: Secondary | ICD-10-CM | POA: Insufficient documentation

## 2018-09-09 DIAGNOSIS — E119 Type 2 diabetes mellitus without complications: Secondary | ICD-10-CM | POA: Diagnosis not present

## 2018-09-09 DIAGNOSIS — Z23 Encounter for immunization: Secondary | ICD-10-CM

## 2018-09-09 DIAGNOSIS — I10 Essential (primary) hypertension: Secondary | ICD-10-CM | POA: Diagnosis not present

## 2018-09-09 DIAGNOSIS — Z0000 Encounter for general adult medical examination without abnormal findings: Secondary | ICD-10-CM

## 2018-09-09 LAB — POCT GLYCOSYLATED HEMOGLOBIN (HGB A1C): Hemoglobin A1C: 6.2 % — AB (ref 4.0–5.6)

## 2018-09-09 LAB — POCT UA - MICROALBUMIN
CREATININE, POC: 200 mg/dL
Microalbumin Ur, POC: 10 mg/L

## 2018-09-09 MED ORDER — ZOSTER VAC RECOMB ADJUVANTED 50 MCG/0.5ML IM SUSR: 0.5000 mL | Freq: Once | INTRAMUSCULAR | 0 refills | Status: AC

## 2018-09-09 NOTE — Assessment & Plan Note (Addendum)
A1c is 6.2- great! Microalubumin is normal today! Please continue to check BS in morning several times/week. Please continue all medications as directed. We will call you when lab results are available. Continue with your Nephrologist as directed. Continue to walk daily and follow heart healthy diet. Continue to keep alcohol intake to a minimum. Follow-up in 3 months, please come fasting so we can check you cholesterol levels

## 2018-09-09 NOTE — Assessment & Plan Note (Addendum)
Followed by Nephrologist  08/21/18 OV: AKI resolving, no change to meds Est. GFR Non-African American 08/18/2018 67 >=60 ML/MIN/1.73 M*2 Final  F/u in 6 months

## 2018-09-09 NOTE — Patient Instructions (Addendum)

## 2018-09-09 NOTE — Assessment & Plan Note (Signed)
>>  ASSESSMENT AND PLAN FOR DIABETES MELLITUS ([place]) WRITTEN ON 09/09/2018  8:38 AM BY DANFORD, KATY D, NP  Lab Results  Component Value Date   HGBA1C 6.2 (A) 09/09/2018   HGBA1C 5.7 (H) 06/24/2018   HGBA1C 5.7 (A) 06/24/2018   AM BS 120 Denies episodes of hypoglycemia Continue Metformin 500mg  1/2 tab QAM

## 2018-09-09 NOTE — Progress Notes (Signed)
 Subjective:    Patient ID: Jason [name], male    DOB: 04/21/1946, 72 y.o.   MRN: [id number]  HPI:  Mr. Levan is here for CPE He reports medication compliance, denies SE He reports AM BS consistently 120s, denies episodes of hypoglycemia He estimates to drink >60 oz water/day and has continues to limit ETOH drinks to 2-4 per week He continues to walk daily and follow heart healthy diet He continue to abstain from tobacco/vape use He reports improved sleep since reducing ETOH He denies acute complaints  A1c today 6.2 CMP re-drawn today  Healthcare Maintenance: Colonoscopy-Referral to GI placed, last 12/2007 Immunizations-Zoster ordered LDCT-N/A AAA Screening-completed 10/18, repeat 10/20  Patient Care Team    Relationship Specialty Notifications Start End  Danford, Katy D, NP PCP - General Family Medicine  04/15/17   [name], Ahnaf David, OD Referring Physician Optometry  06/12/17   Nwobu, Ikechukwu O, MD Consulting Physician   08/25/18     Patient Active Problem List   Diagnosis Date Noted  . CKD (chronic kidney disease), stage III (HCC) 09/09/2018  . Diabetes mellitus without complication (HCC) 02/17/2018  . Hyperlipidemia associated with type 2 diabetes mellitus (HCC) 05/16/2017  . Type 2 diabetes mellitus (HCC) 05/16/2017  . HTN (hypertension) 04/15/2017  . Routine health maintenance 04/15/2017  . Fatigue 04/15/2017     Past Medical History:  Diagnosis Date  . Cataract   . Diabetes mellitus without complication (HCC)   . Hypertension   . Wrist injury      Past Surgical History:  Procedure Laterality Date  . APPENDECTOMY    . CATARACT EXTRACTION       Family History  Problem Relation Age of Onset  . Stroke Mother   . Arthritis Father      Social History   Substance and Sexual Activity  Drug Use No     Social History   Substance and Sexual Activity  Alcohol Use Yes  . Alcohol/week: 4.0 standard drinks  . Types: 4 Cans of beer  per week     Social History   Tobacco Use  Smoking Status Never Smoker  Smokeless Tobacco Never Used     Outpatient Encounter Medications as of 09/09/2018  Medication Sig  . atorvastatin (LIPITOR) 10 MG tablet Take 1/2 tablet daily  . blood glucose meter kit and supplies Please dispense One Touch Ultra Kit for use to check fasting blood sugars once daily  . Blood Glucose Monitoring Suppl (ACCU-CHEK AVIVA PLUS) w/Device KIT Use to check blood sugars fasting every morning and 2 hours after largest meal  . Blood Pressure KIT Use to check blood pressure twice daily  . glucose blood (ACCU-CHEK AVIVA PLUS) test strip Use to check blood sugars fasting every morning and 2 hours after largest meal  . Lancets (ACCU-CHEK SOFT TOUCH) lancets Use to check blood sugars fasting every morning and 2 hours after largest meal  . Lancets Misc. (ACCU-CHEK SOFTCLIX LANCET DEV) KIT Use to check blood sugars fasting every morning and 2 hours after largest meal  . lisinopril-hydrochlorothiazide (PRINZIDE,ZESTORETIC) 10-12.5 MG tablet TAKE 1 TABLET BY MOUTH EVERY DAY  . metFORMIN (GLUCOPHAGE) 500 MG tablet 1/2 tablet daily with breakfast  . Zoster Vaccine Adjuvanted (SHINGRIX) injection Inject 0.5 mLs into the muscle once for 1 dose.   No facility-administered encounter medications on file as of 09/09/2018.     Allergies: Patient has no known allergies.  Body mass index is 24.77 kg/m.  Blood pressure (!) 145/80, pulse 96,   temperature 98.5 F (36.9 C), temperature source Oral, height 5' 10" (1.778 m), weight 172 lb 9.6 oz (78.3 kg), SpO2 97 %.   Review of Systems  Constitutional: Positive for fatigue. Negative for activity change, appetite change, chills, diaphoresis, fever and unexpected weight change.  HENT: Negative for congestion.   Eyes: Negative for visual disturbance.  Respiratory: Negative for cough, chest tightness, shortness of breath, wheezing and stridor.   Cardiovascular: Negative for  chest pain, palpitations and leg swelling.  Gastrointestinal: Negative for abdominal distention, anal bleeding, blood in stool, constipation, diarrhea, nausea and vomiting.  Endocrine: Negative for cold intolerance, heat intolerance, polydipsia, polyphagia and polyuria.  Musculoskeletal: Positive for arthralgias. Negative for back pain, gait problem, joint swelling, myalgias, neck pain and neck stiffness.  Skin: Negative for color change, rash and wound.  Neurological: Negative for dizziness and headaches.  Hematological: Does not bruise/bleed easily.  Psychiatric/Behavioral: Negative for behavioral problems, confusion, decreased concentration, dysphoric mood, hallucinations, self-injury, sleep disturbance and suicidal ideas. The patient is not nervous/anxious and is not hyperactive.        Objective:   Physical Exam  Constitutional: He is oriented to person, place, and time. He appears well-developed and well-nourished. No distress.  HENT:  Head: Normocephalic and atraumatic.  Right Ear: External ear normal. Tympanic membrane is not erythematous and not bulging. No decreased hearing is noted.  Left Ear: External ear normal. Tympanic membrane is not erythematous and not bulging. No decreased hearing is noted.  Nose: Nose normal. No mucosal edema or rhinorrhea. Right sinus exhibits no maxillary sinus tenderness and no frontal sinus tenderness. Left sinus exhibits no maxillary sinus tenderness and no frontal sinus tenderness.  Mouth/Throat: Oropharynx is clear and moist and mucous membranes are normal. Abnormal dentition. No posterior oropharyngeal edema or posterior oropharyngeal erythema. Tonsils are 0 on the right. Tonsils are 0 on the left. No tonsillar exudate.  Eyes: Pupils are equal, round, and reactive to light. Conjunctivae and lids are normal.  Neck: Normal range of motion. Neck supple.  Cardiovascular: Normal rate, regular rhythm, normal heart sounds and intact distal pulses.  No  murmur heard. Pulmonary/Chest: Effort normal and breath sounds normal. No stridor. No respiratory distress. He has no wheezes. He has no rales. He exhibits no tenderness.  Abdominal: Soft. Bowel sounds are normal. He exhibits no distension and no mass. There is no tenderness. There is no rebound and no guarding. No hernia.  Genitourinary:  Genitourinary Comments: Declined DRE/genital examination  Musculoskeletal: Normal range of motion. He exhibits no edema or tenderness.  Lymphadenopathy:    He has no cervical adenopathy.  Neurological: He is alert and oriented to person, place, and time.  Skin: Skin is warm and dry. Capillary refill takes less than 2 seconds. No rash noted. He is not diaphoretic. No erythema. No pallor.  Psychiatric: He has a normal mood and affect. His behavior is normal. Judgment and thought content normal.      Assessment & Plan:   1. Diabetes mellitus without complication ([place])   2. Need for zoster vaccination   3. Screening for colon cancer   4. Essential hypertension   5. Routine health maintenance   6. CKD (chronic kidney disease), stage III (HCC)     Routine health maintenance A1c is 6.2- great! Microalubumin is normal today! Please continue to check BS in morning several times/week. Please continue all medications as directed. We will call you when lab results are available. Continue with your Nephrologist as directed. Continue to walk daily  and follow heart healthy diet. Continue to keep alcohol intake to a minimum. Follow-up in 3 months, please come fasting so we can check you cholesterol levels  Diabetes mellitus without complication (HCC) Lab Results  Component Value Date   HGBA1C 6.2 (A) 09/09/2018   HGBA1C 5.7 (H) 06/24/2018   HGBA1C 5.7 (A) 06/24/2018   AM BS 120 Denies episodes of hypoglycemia Continue Metformin 579m 1/2 tab QAM  CKD (chronic kidney disease), stage III ([place] Followed by Nephrologist  08/21/18 OV: AKI resolving, no  change to meds Est. GFR Non-African American 08/18/2018 67 >=60 ML/MIN/1.73 M*2 Final  F/u in 6 months  HTN (hypertension) BP 145/80, HR 96 Continue Prinzide 10/12.5107mQD    FOLLOW-UP:  Return in about 3 months (around 12/09/2018) for Regular Follow Up, HTN, Hypercholestermia, Diabetes.

## 2018-09-09 NOTE — Assessment & Plan Note (Signed)
Lab Results  Component Value Date   HGBA1C 6.2 (A) 09/09/2018   HGBA1C 5.7 (H) 06/24/2018   HGBA1C 5.7 (A) 06/24/2018   AM BS 120 Denies episodes of hypoglycemia Continue Metformin 500mg  1/2 tab QAM

## 2018-09-09 NOTE — Assessment & Plan Note (Signed)
BP 145/80, HR 96 Continue Prinzide 10/12.5mg  QD

## 2018-09-10 ENCOUNTER — Encounter: Payer: Self-pay | Admitting: Gastroenterology

## 2018-09-10 IMAGING — US US AORTA SCREENING (MEDICARE)
1 series · 8 of 8 positions shown · non-contrast
Comparison: None.

CLINICAL DATA: Appropriate age.  First time exam.  Tobacco use.

EXAM:
US ABDOMINAL AORTA MEDICARE SCREENING
TECHNIQUE: Ultrasound examination of the abdominal aorta was performed as a
screening evaluation for abdominal aortic aneurysm.

[Series 1: us aorta screening (medicare) · 0.26mm/px · 8 of 8 slices shown]
[im 1/8]
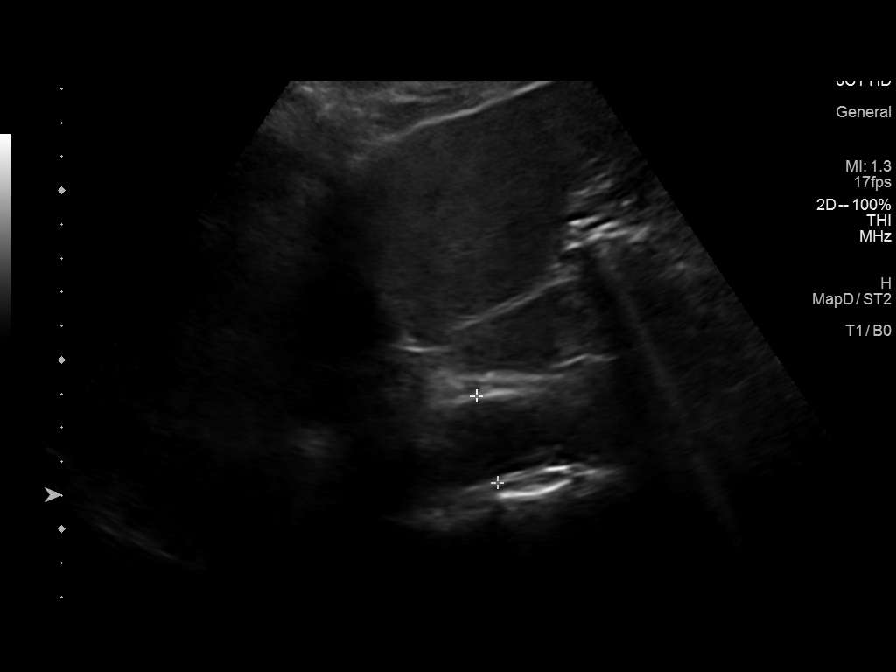
[im 2/8]
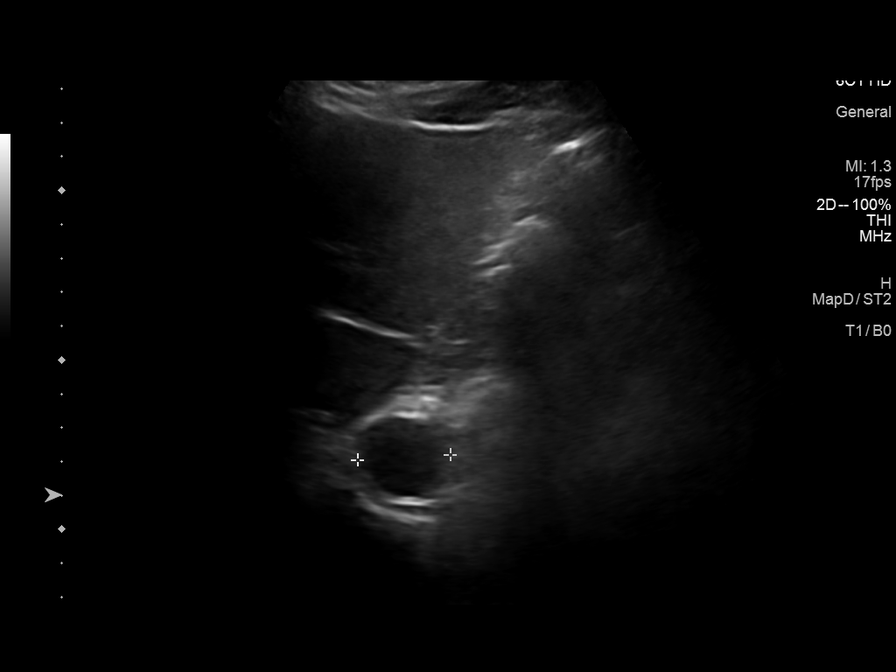
[im 3/8]
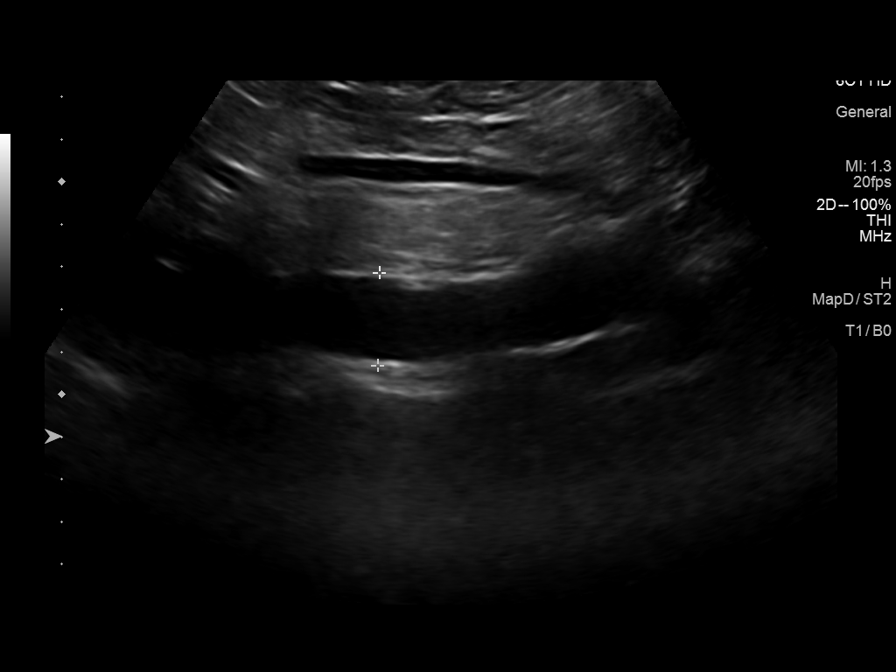
[im 4/8]
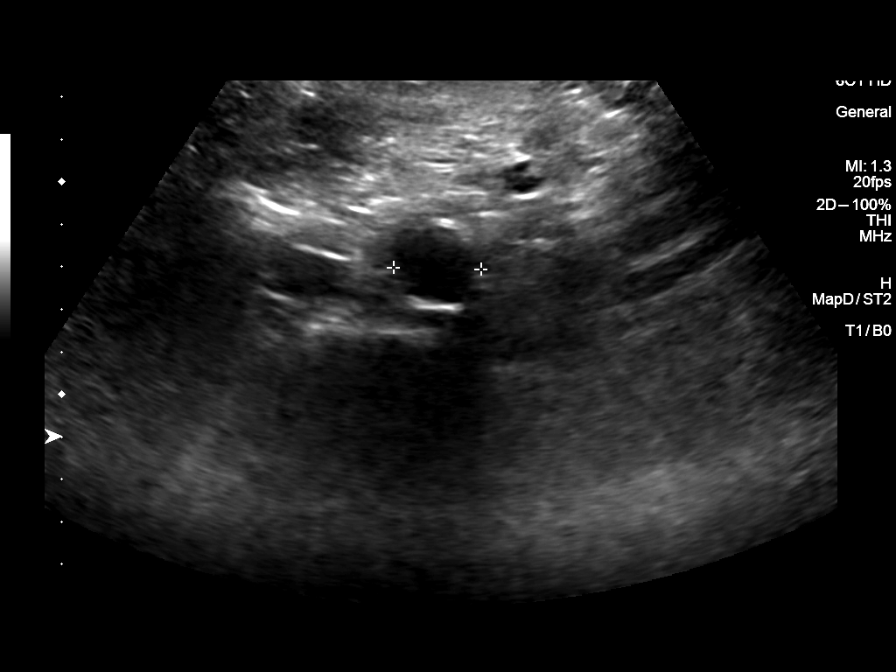
[im 5/8]
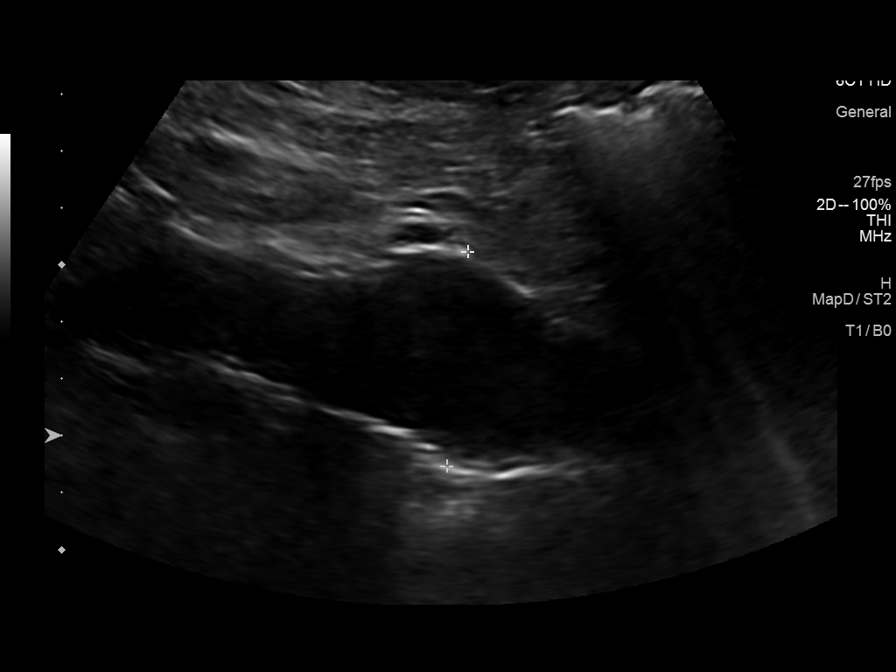
[im 6/8]
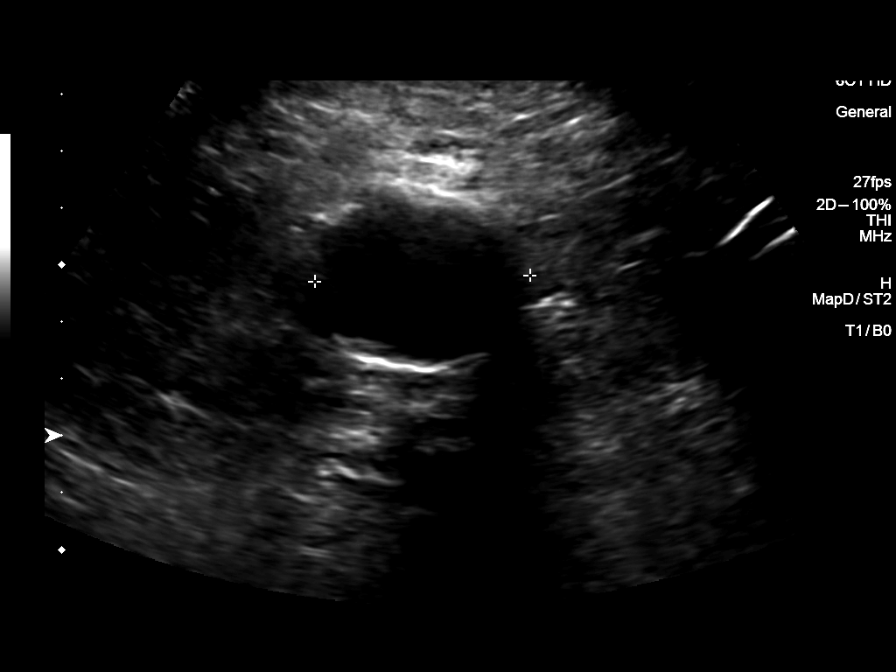
[im 7/8]
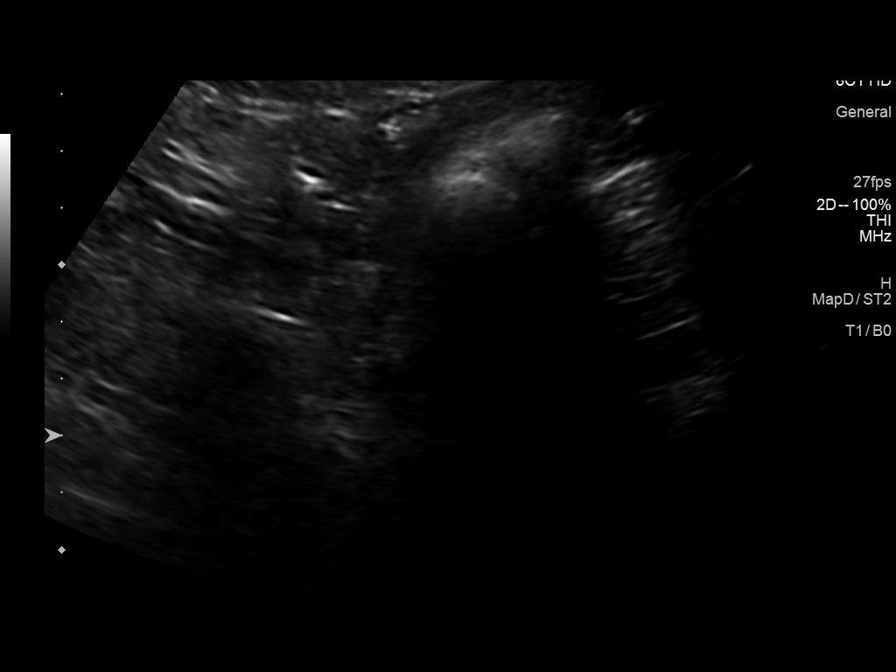
[im 8/8]
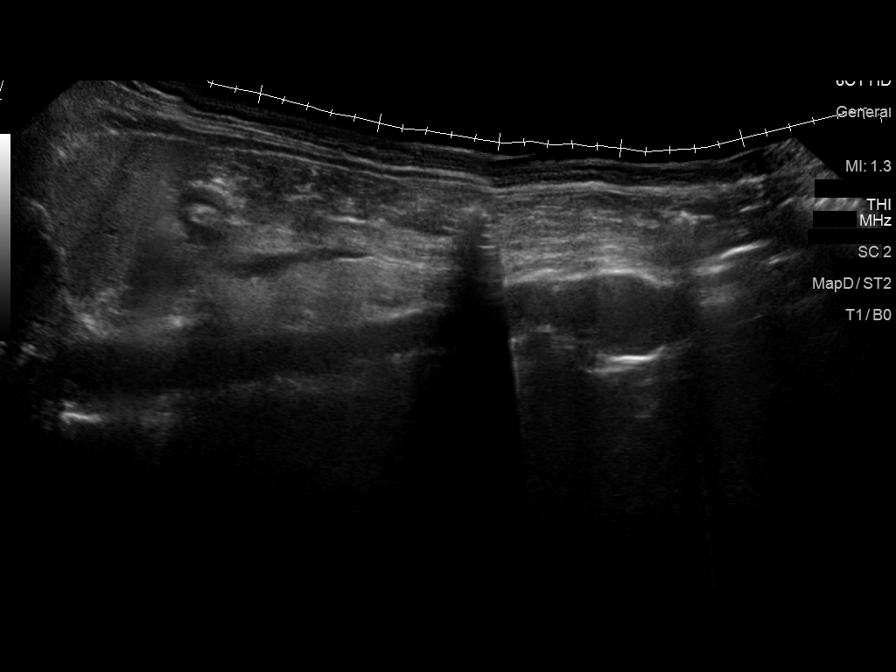

[8 of 8 positions shown; findings below may reference images not displayed]

FINDINGS: Abdominal aortic measurements as follows:

Proximal:  2.7 cm

Mid:  2.2 cm

Distal:  3.8 cm
IMPRESSION: Maximal diameter of the distal abdominal aorta is 3.8 cm. Recommend
followup by ultrasound in 2 years. This recommendation follows ACR
consensus guidelines: White Paper of the ACR Incidental Findings
Committee II on Vascular Findings. [HOSPITAL] 5912;

## 2018-09-15 ENCOUNTER — Telehealth: Payer: Self-pay | Admitting: Adult Health

## 2018-09-15 NOTE — Telephone Encounter (Signed)
Patient requested Rx refill on  :   lisinopril-hydrochlorothiazide (PRINZIDE,ZESTORETIC) 10-12.5 MG tablet [[id number]]   Order Details  Dose, Route, Frequency: As Directed   Dispense Quantity: 90 tablet Refills: 0 Fills remaining: --        Sig: TAKE 1 TABLET BY MOUTH EVERY DAY          ---- Forward request to medical assistant if refill approved to send to :   [id number] [address], [place]. [phone] (Phone) [phone] (Fax)   --Dion Body

## 2018-09-15 NOTE — Telephone Encounter (Signed)
Pt informed that it is too early to refill this medication.  Pt expressed understanding.  Charyl Bigger, CMA

## 2018-09-16 ENCOUNTER — Other Ambulatory Visit: Payer: Self-pay | Admitting: Adult Health

## 2018-09-16 ENCOUNTER — Telehealth: Payer: Self-pay | Admitting: Adult Health

## 2018-09-16 MED ORDER — LISINOPRIL 10 MG PO TABS: 10.0000 mg | ORAL_TABLET | Freq: Every day | ORAL | 0 refills | Status: DC

## 2018-09-16 MED ORDER — HYDROCHLOROTHIAZIDE 12.5 MG PO CAPS: 12.5000 mg | ORAL_CAPSULE | Freq: Every day | ORAL | 0 refills | Status: DC

## 2018-09-16 NOTE — Telephone Encounter (Signed)
Pt informed that RXs for the individual medications rather than combo pill sent to pharmacy d/t combo pill not being available.  Pt expressed understanding.  Charyl Bigger, CMA

## 2018-09-16 NOTE — Telephone Encounter (Signed)
Patient is requesting a refill of his lisinopril, patient contacted pharm and was instructed to call PCP for refill. If [facility] [address]

## 2018-10-14 ENCOUNTER — Encounter: Payer: Medicare Other | Admitting: Gastroenterology

## 2018-10-27 ENCOUNTER — Other Ambulatory Visit: Payer: Self-pay | Admitting: Adult Health

## 2018-10-27 NOTE — Telephone Encounter (Signed)
I am unable to determine if pt is to be taking one tablet daily or 1/2 tablet daily on the metformin.  Please review and refill if appropriate.  Charyl Bigger, CMA

## 2018-12-09 ENCOUNTER — Ambulatory Visit: Payer: Medicare Other | Admitting: Adult Health

## 2018-12-17 ENCOUNTER — Other Ambulatory Visit: Payer: Self-pay | Admitting: Adult Health

## 2019-03-10 ENCOUNTER — Other Ambulatory Visit: Payer: Self-pay | Admitting: Adult Health

## 2019-03-10 ENCOUNTER — Telehealth: Payer: Self-pay

## 2019-03-10 NOTE — Telephone Encounter (Signed)
Please call pt to schedule OV for f/u.  No further refills until pt is seen.  Charyl Bigger, CMA

## 2019-04-03 ENCOUNTER — Other Ambulatory Visit: Payer: Self-pay | Admitting: Adult Health

## 2019-04-14 ENCOUNTER — Other Ambulatory Visit: Payer: Self-pay | Admitting: Adult Health

## 2019-04-18 ENCOUNTER — Other Ambulatory Visit: Payer: Self-pay | Admitting: Adult Health

## 2019-04-23 ENCOUNTER — Encounter: Payer: Self-pay | Admitting: Gastroenterology

## 2019-05-14 ENCOUNTER — Other Ambulatory Visit: Payer: Self-pay

## 2019-05-14 ENCOUNTER — Encounter: Payer: Self-pay | Admitting: Adult Health

## 2019-05-14 ENCOUNTER — Telehealth: Payer: Self-pay | Admitting: Adult Health

## 2019-05-14 ENCOUNTER — Ambulatory Visit (INDEPENDENT_AMBULATORY_CARE_PROVIDER_SITE_OTHER): Payer: Medicare Other | Admitting: Adult Health

## 2019-05-14 VITALS — BP 136/87 | HR 102 | Ht 70.0 in | Wt 180.0 lb

## 2019-05-14 DIAGNOSIS — E119 Type 2 diabetes mellitus without complications: Secondary | ICD-10-CM | POA: Diagnosis not present

## 2019-05-14 DIAGNOSIS — E1169 Type 2 diabetes mellitus with other specified complication: Secondary | ICD-10-CM | POA: Diagnosis not present

## 2019-05-14 DIAGNOSIS — N183 Chronic kidney disease, stage 3 unspecified: Secondary | ICD-10-CM

## 2019-05-14 DIAGNOSIS — I10 Essential (primary) hypertension: Secondary | ICD-10-CM

## 2019-05-14 DIAGNOSIS — E785 Hyperlipidemia, unspecified: Secondary | ICD-10-CM

## 2019-05-14 DIAGNOSIS — Z0000 Encounter for general adult medical examination without abnormal findings: Secondary | ICD-10-CM

## 2019-05-14 MED ORDER — ATORVASTATIN CALCIUM 10 MG PO TABS: ORAL_TABLET | ORAL | 0 refills | Status: DC

## 2019-05-14 MED ORDER — METFORMIN HCL 500 MG PO TABS: ORAL_TABLET | ORAL | 0 refills | Status: DC

## 2019-05-14 MED ORDER — LISINOPRIL 10 MG PO TABS: ORAL_TABLET | ORAL | 0 refills | Status: DC

## 2019-05-14 MED ORDER — HYDROCHLOROTHIAZIDE 12.5 MG PO CAPS: ORAL_CAPSULE | ORAL | 0 refills | Status: DC

## 2019-05-14 NOTE — Assessment & Plan Note (Signed)
Ambulatory BP readings- SBP 120-130 DBP 70-90 He denies acute cardiac sx's Continue Lisinopril 10mg  QD

## 2019-05-14 NOTE — Assessment & Plan Note (Signed)
>>  ASSESSMENT AND PLAN FOR DIABETES MELLITUS ([place]) WRITTEN ON 05/14/2019  9:58 AM BY DANFORD, KATY D, NP  Lab Results  Component Value Date   HGBA1C 6.2 (A) 09/09/2018   HGBA1C 5.7 (H) 06/24/2018   HGBA1C 5.7 (A) 06/24/2018

## 2019-05-14 NOTE — Assessment & Plan Note (Signed)
Lab Results  Component Value Date   HGBA1C 6.2 (A) 09/09/2018   HGBA1C 5.7 (H) 06/24/2018   HGBA1C 5.7 (A) 06/24/2018

## 2019-05-14 NOTE — Progress Notes (Signed)
Virtual Visit via Telephone Note  I connected with Jason [name] on 05/14/19 at  9:45 AM EDT by telephone and verified that I am speaking with the correct person using two identifiers.  Location: Patient: Home Provider: In Clinic   I discussed the limitations, risks, security and privacy concerns of performing an evaluation and management service by telephone and the availability of in person appointments. I also discussed with the patient that there may be a patient responsible charge related to this service. The patient expressed understanding and agreed to proceed.   History of Present Illness: 09/09/18 OV: Jason [name] is here for CPE He reports medication compliance, denies SE He reports AM BS consistently 120s, denies episodes of hypoglycemia He estimates to drink >60 oz water/day and has continues to limit ETOH drinks to 2-4 per week He continues to walk daily and follow heart healthy diet He continue to abstain from tobacco/vape use He reports improved sleep since reducing ETOH He denies acute complaints  A1c today 6.2 CMP re-drawn today 05/14/2019 OV: Jason [name] is calling in for regular f/u: T2D, HLD, HTN He reports AM BG 110-120s, however infrequently checks his BG at home- advised to check at least 3 times/week or if he feels any sx's of hypoglycemia He denies episodes of hypoglycemia He is currently taking Metformin 528m- 1/2 tab with breakfast CMP 06/2018 GFR 42 He is PAST due for labs- did not f/u as advised  Ambulatory BP readings- SBP 120-130 DBP 70-90 He denies acute cardiac sx's He drinks water all day long, really cannot offer oz estimate He estimates to consume 1-2 cocktails (vodka) 3-4 nights per week He continues to abstain from tobacco/vape use He walks daily and consistently follows diabetic diet   Patient Care Team    Relationship Specialty Notifications Start End  DMina MarbleD, NP PCP - General Family Medicine  04/15/17   MBarbaraann Cao OOlivetReferring Physician Optometry  06/12/17   NTracie Harrier MD Consulting Physician   08/25/18     Patient Active Problem List   Diagnosis Date Noted  . CKD (chronic kidney disease), stage III ([place] 09/09/2018  . Diabetes mellitus without complication ([place] 030/03/6225 . Hyperlipidemia associated with type 2 diabetes mellitus ([place] 05/16/2017  . Type 2 diabetes mellitus ([place] 05/16/2017  . HTN (hypertension) 04/15/2017  . Routine health maintenance 04/15/2017  . Fatigue 04/15/2017     Past Medical History:  Diagnosis Date  . Cataract   . Diabetes mellitus without complication ([place]   . Hypertension   . Wrist injury      Past Surgical History:  Procedure Laterality Date  . APPENDECTOMY    . CATARACT EXTRACTION       Family History  Problem Relation Age of Onset  . Stroke Mother   . Arthritis Father      Social History   Substance and Sexual Activity  Drug Use No     Social History   Substance and Sexual Activity  Alcohol Use Yes  . Alcohol/week: 4.0 standard drinks  . Types: 4 Cans of beer per week     Social History   Tobacco Use  Smoking Status Never Smoker  Smokeless Tobacco Never Used     Outpatient Encounter Medications as of 05/14/2019  Medication Sig  . atorvastatin (LIPITOR) 10 MG tablet TAKE 1 TABLET BY MOUTH EVERY DAY (Patient taking differently: Take 5 mg by mouth daily at 6 PM. TAKE 1 TABLET BY MOUTH EVERY DAY)  .  blood glucose meter kit and supplies Please dispense One Touch Ultra Kit for use to check fasting blood sugars once daily  . Blood Glucose Monitoring Suppl (ACCU-CHEK AVIVA PLUS) w/Device KIT Use to check blood sugars fasting every morning and 2 hours after largest meal  . Blood Pressure KIT Use to check blood pressure twice daily  . glucose blood (ACCU-CHEK AVIVA PLUS) test strip Use to check blood sugars fasting every morning and 2 hours after largest meal  . hydrochlorothiazide (MICROZIDE) 12.5 MG capsule  TAKE 1 CAPSULE BY MOUTH DAILY. PATIENT MUST HAVE OFFICE VISIT PRIOR TO ANY FURTHER REFILLS  . Lancets (ACCU-CHEK SOFT TOUCH) lancets Use to check blood sugars fasting every morning and 2 hours after largest meal  . Lancets Misc. (ACCU-CHEK SOFTCLIX LANCET DEV) KIT Use to check blood sugars fasting every morning and 2 hours after largest meal  . lisinopril (ZESTRIL) 10 MG tablet One tablet by mouth daily  . metFORMIN (GLUCOPHAGE) 500 MG tablet TAKE 1/2 TABELT DAILY WITH A MEAL.  . [DISCONTINUED] lisinopril (ZESTRIL) 10 MG tablet TAKE 1 TABLET BY MOUTH DAILY. PATIENT MUST HAVE OFFICE VISIT PRIOR TO ANY FURTHER REFILLS  . [DISCONTINUED] metFORMIN (GLUCOPHAGE) 500 MG tablet TAKE 1 TABELT DAILY WITH A MEAL. (Patient taking differently: Take 250 mg by mouth daily with breakfast. TAKE 1 TABELT DAILY WITH A MEAL.)  . [DISCONTINUED] metFORMIN (GLUCOPHAGE) 500 MG tablet TAKE 1 TABELT DAILY WITH A MEAL.  . [DISCONTINUED] lisinopril-hydrochlorothiazide (PRINZIDE,ZESTORETIC) 10-12.5 MG tablet TAKE 1 TABLET BY MOUTH EVERY DAY   No facility-administered encounter medications on file as of 05/14/2019.     Allergies: Patient has no known allergies.  Body mass index is 25.83 kg/m.  Blood pressure 136/87, pulse (!) 102, height 5' 10" (1.778 m), weight 180 lb (81.6 kg). Review of Systems: General:   Denies fever, chills, unexplained weight loss.  Optho/Auditory:   Denies visual changes, blurred vision/LOV Respiratory:   Denies SOB, DOE more than baseline levels.  Cardiovascular:   Denies chest pain, palpitations, new onset peripheral edema  Gastrointestinal:   Denies nausea, vomiting, diarrhea.  Genitourinary: Denies dysuria, freq/ urgency, flank pain or discharge from genitals.  Endocrine:     Denies hot or cold intolerance, polyuria, polydipsia. Musculoskeletal:   Denies unexplained myalgias, joint swelling, unexplained arthralgias, gait problems.  Skin:  Denies rash, suspicious lesions Neurological:      Denies dizziness, unexplained weakness, numbness  Psychiatric/Behavioral:   Denies mood changes, suicidal or homicidal ideations, hallucinations This patient does not have sx concerning for COVID-19 Infection (ie; fever, chills, cough, new or worsening shortness of breath).     Observations/Objective: No acute distress noted during the telephone conversation  Assessment and Plan: Continue all medications as directed. Remain well hydrated, follow diabetic diet Continue to check AM BG each am and BP/HR several times/week Continue to social distance and wear a mask when in public  Follow Up Instructions: Fasting lab appt next week OV in 3 months Reinforced that he needs OV every 3-4 months   I discussed the assessment and treatment plan with the patient. The patient was provided an opportunity to ask questions and all were answered. The patient agreed with the plan and demonstrated an understanding of the instructions.   The patient was advised to call back or seek an in-person evaluation if the symptoms worsen or if the condition fails to improve as anticipated.  I provided 18 minutes of non-face-to-face time during this encounter.   Katy D Danford, NP  

## 2019-05-14 NOTE — Addendum Note (Signed)
Addended by: Fonnie Mu on: 05/14/2019 03:52 PM   Modules accepted: Orders

## 2019-05-14 NOTE — Telephone Encounter (Signed)
Patient had a telemed visit today with Jason [name] and got 2 refills, he has called backed stating he is also out of his atorvastatin and hydrochlorothiazide. [facility] [address]

## 2019-05-14 NOTE — Assessment & Plan Note (Signed)
Assessment and Plan: Continue all medications as directed. Remain well hydrated, follow diabetic diet Continue to check AM BG each am and BP/HR several times/week Continue to social distance and wear a mask when in public  Follow Up Instructions: Fasting lab appt next week OV in 3 months Reinforced that he needs OV every 3-4 months   I discussed the assessment and treatment plan with the patient. The patient was provided an opportunity to ask questions and all were answered. The patient agreed with the plan and demonstrated an understanding of the instructions.

## 2019-05-19 ENCOUNTER — Other Ambulatory Visit: Payer: Self-pay

## 2019-05-19 ENCOUNTER — Other Ambulatory Visit: Payer: Medicare Other

## 2019-05-19 DIAGNOSIS — Z0000 Encounter for general adult medical examination without abnormal findings: Secondary | ICD-10-CM

## 2019-05-19 DIAGNOSIS — N183 Chronic kidney disease, stage 3 unspecified: Secondary | ICD-10-CM

## 2019-05-19 DIAGNOSIS — I10 Essential (primary) hypertension: Secondary | ICD-10-CM

## 2019-05-19 DIAGNOSIS — E1169 Type 2 diabetes mellitus with other specified complication: Secondary | ICD-10-CM

## 2019-05-19 DIAGNOSIS — E119 Type 2 diabetes mellitus without complications: Secondary | ICD-10-CM

## 2019-05-20 ENCOUNTER — Other Ambulatory Visit: Payer: Self-pay | Admitting: Adult Health

## 2019-05-20 DIAGNOSIS — E1169 Type 2 diabetes mellitus with other specified complication: Secondary | ICD-10-CM

## 2019-05-20 DIAGNOSIS — Z79899 Other long term (current) drug therapy: Secondary | ICD-10-CM

## 2019-05-20 LAB — COMPREHENSIVE METABOLIC PANEL
ALT: 20 IU/L (ref 0–44)
AST: 22 IU/L (ref 0–40)
Albumin/Globulin Ratio: 2 (ref 1.2–2.2)
Albumin: 4.2 g/dL (ref 3.8–4.8)
Alkaline Phosphatase: 52 IU/L (ref 39–117)
BUN/Creatinine Ratio: 12 (ref 10–24)
BUN: 15 mg/dL (ref 8–27)
Bilirubin Total: 0.8 mg/dL (ref 0.0–1.2)
CO2: 21 mmol/L (ref 20–29)
Calcium: 9.8 mg/dL (ref 8.6–10.2)
Chloride: 97 mmol/L (ref 96–106)
Creatinine, Ser: 1.21 mg/dL (ref 0.76–1.27)
GFR calc Af Amer: 70 mL/min/{1.73_m2} (ref >59)
GFR calc non Af Amer: 60 mL/min/{1.73_m2} (ref >59)
Globulin, Total: 2.1 g/dL (ref 1.5–4.5)
Glucose: 166 mg/dL — ABNORMAL HIGH (ref 65–99)
Potassium: 4.4 mmol/L (ref 3.5–5.2)
Sodium: 136 mmol/L (ref 134–144)
Total Protein: 6.3 g/dL (ref 6.0–8.5)

## 2019-05-20 LAB — CBC WITH DIFFERENTIAL/PLATELET
Basophils Absolute: 0 10*3/uL (ref 0.0–0.2)
Basos: 1 %
EOS (ABSOLUTE): 0.2 10*3/uL (ref 0.0–0.4)
Eos: 2 %
Hematocrit: 43.3 % (ref 37.5–51.0)
Hemoglobin: 15.6 g/dL (ref 13.0–17.7)
Immature Grans (Abs): 0 10*3/uL (ref 0.0–0.1)
Immature Granulocytes: 0 %
Lymphocytes Absolute: 1.9 10*3/uL (ref 0.7–3.1)
Lymphs: 25 %
MCH: 36.8 pg — ABNORMAL HIGH (ref 26.6–33.0)
MCHC: 36 g/dL — ABNORMAL HIGH (ref 31.5–35.7)
MCV: 102 fL — ABNORMAL HIGH (ref 79–97)
Monocytes Absolute: 0.8 10*3/uL (ref 0.1–0.9)
Monocytes: 11 %
Neutrophils Absolute: 4.6 10*3/uL (ref 1.4–7.0)
Neutrophils: 61 %
Platelets: 196 10*3/uL (ref 150–450)
RBC: 4.24 x10E6/uL (ref 4.14–5.80)
RDW: 12.8 % (ref 11.6–15.4)
WBC: 7.5 10*3/uL (ref 3.4–10.8)

## 2019-05-20 LAB — LIPID PANEL
Chol/HDL Ratio: 3.3 ratio (ref 0.0–5.0)
Cholesterol, Total: 163 mg/dL (ref 100–199)
HDL: 49 mg/dL (ref >39)
LDL Calculated: 97 mg/dL (ref 0–99)
Triglycerides: 87 mg/dL (ref 0–149)
VLDL Cholesterol Cal: 17 mg/dL (ref 5–40)

## 2019-05-20 LAB — TSH: TSH: 1.32 u[IU]/mL (ref 0.450–4.500)

## 2019-05-20 LAB — HEMOGLOBIN A1C
Est. average glucose Bld gHb Est-mCnc: 117 mg/dL
Hgb A1c MFr Bld: 5.7 % — ABNORMAL HIGH (ref 4.8–5.6)

## 2019-05-20 MED ORDER — ATORVASTATIN CALCIUM 20 MG PO TABS: ORAL_TABLET | ORAL | 1 refills | Status: DC

## 2019-05-27 ENCOUNTER — Encounter: Payer: Medicare Other | Admitting: Gastroenterology

## 2019-07-01 ENCOUNTER — Other Ambulatory Visit: Payer: Self-pay

## 2019-07-02 ENCOUNTER — Other Ambulatory Visit: Payer: Self-pay

## 2019-07-02 ENCOUNTER — Other Ambulatory Visit (INDEPENDENT_AMBULATORY_CARE_PROVIDER_SITE_OTHER): Payer: Medicare Other

## 2019-07-02 DIAGNOSIS — Z23 Encounter for immunization: Secondary | ICD-10-CM

## 2019-07-02 DIAGNOSIS — Z79899 Other long term (current) drug therapy: Secondary | ICD-10-CM

## 2019-07-02 DIAGNOSIS — E785 Hyperlipidemia, unspecified: Secondary | ICD-10-CM

## 2019-07-02 DIAGNOSIS — E1169 Type 2 diabetes mellitus with other specified complication: Secondary | ICD-10-CM

## 2019-07-02 NOTE — Addendum Note (Signed)
Addended by: Fonnie Mu on: 07/02/2019 10:10 AM   Modules accepted: Orders

## 2019-07-02 NOTE — Progress Notes (Signed)
Pt here for influenza vaccine.  Screening questionnaire reviewed, VIS provided to patient, and any/all patient questions answered.  T. Nelson, CMA  

## 2019-07-03 LAB — HEPATIC FUNCTION PANEL
ALT: 18 IU/L (ref 0–44)
AST: 20 IU/L (ref 0–40)
Albumin: 4.2 g/dL (ref 3.8–4.8)
Alkaline Phosphatase: 66 IU/L (ref 39–117)
Bilirubin Total: 1 mg/dL (ref 0.0–1.2)
Bilirubin, Direct: 0.38 mg/dL (ref 0.00–0.40)
Total Protein: 6.9 g/dL (ref 6.0–8.5)

## 2019-08-07 ENCOUNTER — Other Ambulatory Visit: Payer: Self-pay | Admitting: Adult Health

## 2019-08-10 ENCOUNTER — Ambulatory Visit (INDEPENDENT_AMBULATORY_CARE_PROVIDER_SITE_OTHER): Payer: Medicare Other | Admitting: Adult Health

## 2019-08-10 ENCOUNTER — Encounter: Payer: Self-pay | Admitting: Adult Health

## 2019-08-10 ENCOUNTER — Other Ambulatory Visit: Payer: Self-pay

## 2019-08-10 VITALS — BP 158/83 | HR 80 | Temp 98.9°F | Ht 70.0 in | Wt 178.0 lb

## 2019-08-10 DIAGNOSIS — N1832 Chronic kidney disease, stage 3b: Secondary | ICD-10-CM

## 2019-08-10 DIAGNOSIS — I10 Essential (primary) hypertension: Secondary | ICD-10-CM

## 2019-08-10 DIAGNOSIS — E119 Type 2 diabetes mellitus without complications: Secondary | ICD-10-CM | POA: Diagnosis not present

## 2019-08-10 LAB — POCT GLYCOSYLATED HEMOGLOBIN (HGB A1C): Hemoglobin A1C: 5.9 % — AB (ref 4.0–5.6)

## 2019-08-10 MED ORDER — CLONIDINE HCL 0.2 MG PO TABS
0.1000 mg | ORAL_TABLET | Freq: Once | ORAL | Status: AC
  Administered 2019-08-10: 09:00:00 0.1 mg via ORAL

## 2019-08-10 MED ORDER — LISINOPRIL 40 MG PO TABS: ORAL_TABLET | ORAL | 0 refills | Status: DC

## 2019-08-10 NOTE — Assessment & Plan Note (Addendum)
First BP 215/124, HR 104 Advised that he proceed to nearest ED-he refused- will administer Clonidine in office and monitor. EKG- completed, reviewed with supervising physicain- no ischemic changes noted.  After resting and multiple re-checks BP 158/83, HR 80 AGAIN reviewed at length Red Flag sx's- he denies any. He is develops any- advised to call 911- he verbalized understanding and agreement. Will discuss further cardiac work-up at f/u in 3 days

## 2019-08-10 NOTE — Progress Notes (Signed)
Subjective:    Patient ID: Jason [name], male    DOB: Jun 12, 1946, 73 y.o.   MRN: [id number]  HPI:09/09/18 OV: Jason [name] is here for CPE He reports medication compliance, denies SE He reports AM BS consistently 120s, denies episodes of hypoglycemia He estimates to drink >60 oz water/day and has continues to limit ETOH drinks to 2-4 per week He continues to walk daily and follow heart healthy diet He continue to abstain from tobacco/vape use He reports improved sleep since reducing ETOH He denies acute complaints  A1c today 6.2 CMP re-drawn today 05/14/2019 OV: Jason [name] is calling in for regular f/u: T2D, HLD, HTN He reports AM BG 110-120s, however infrequently checks his BG at home- advised to check at least 3 times/week or if he feels any sx's of hypoglycemia He denies episodes of hypoglycemia He is currently taking Metformin 526m- 1/2 tab with breakfast CMP 06/2018 GFR 42 He is PAST due for labs- did not f/u as advised  Ambulatory BP readings- SBP 120-130 DBP 70-90 He denies acute cardiac sx's He drinks water all day long, really cannot offer oz estimate He estimates to consume 1-2 cocktails (vodka) 3-4 nights per week He continues to abstain from tobacco/vape use He walks daily and consistently follows diabetic diet  08/10/2019 OV: Jason [name] here for regular f/u: HTN, T2D, HLD First BP was exceedingly high- 215/124, HR 104 He reports drinking 4 vodka drinks last night-has not taken his anti-hypertensives this morning. He has steadily been increasing ETOH intake- now up to 4 nights of drinking- 2-3 vodka drinks per night. He denies CP/chest tightness/HA/dizziness/palpitations. He states "I feel just fine". Advised that he proceed to nearest ED-he refused- will administer Clonidine in office and monitor. EKG- completed, reviewed with supervising physicain- no ischemic changes noted.  Reviewed Hepatic Panel- Normal- do not recommend any change to  current statin therapy. He reports AM BG 100-110s, denies episodes of hypoglycemia He is taking Metformin 5074m1/2 tablet QD  Lab Results  Component Value Date   HGBA1C 5.9 (A) 08/10/2019   HGBA1C 5.7 (H) 05/19/2019   HGBA1C 6.2 (A) 09/09/2018   Patient Care Team    Relationship Specialty Notifications Start End  DaMina Marble, NP PCP - General Family Medicine  04/15/17   MiBarbaraann CaoODOak Vieweferring Physician Optometry  06/12/17   NwTracie HarrierMD Consulting Physician   08/25/18     Patient Active Problem List   Diagnosis Date Noted  . CKD (chronic kidney disease), stage III 09/09/2018  . Diabetes mellitus without complication ([facility]0540/98/1191. Hyperlipidemia associated with type 2 diabetes mellitus ([facility]08/16/2018  . Type 2 diabetes mellitus ([facility]08/16/2018  . HTN (hypertension) 04/15/2017  . Routine health maintenance 04/15/2017  . Fatigue 04/15/2017     Past Medical History:  Diagnosis Date  . Cataract   . Diabetes mellitus without complication (HCLeming  . Hypertension   . Wrist injury      Past Surgical History:  Procedure Laterality Date  . APPENDECTOMY    . CATARACT EXTRACTION       Family History  Problem Relation Age of Onset  . Stroke Mother   . Arthritis Father      Social History   Substance and Sexual Activity  Drug Use No     Social History   Substance and Sexual Activity  Alcohol Use Yes  . Alcohol/week: 4.0 standard drinks  . Types: 4 Cans of beer per week  Social History   Tobacco Use  Smoking Status Never Smoker  Smokeless Tobacco Never Used     Outpatient Encounter Medications as of 08/10/2019  Medication Sig  . atorvastatin (LIPITOR) 20 MG tablet TAKE 1 TABLET BY MOUTH EVERY DAY  . blood glucose meter kit and supplies Please dispense One Touch Ultra Kit for use to check fasting blood sugars once daily  . Blood Glucose Monitoring Suppl (ACCU-CHEK AVIVA PLUS) w/Device KIT Use to check blood sugars  fasting every morning and 2 hours after largest meal  . Blood Pressure KIT Use to check blood pressure twice daily  . glucose blood (ACCU-CHEK AVIVA PLUS) test strip Use to check blood sugars fasting every morning and 2 hours after largest meal  . hydrochlorothiazide (MICROZIDE) 12.5 MG capsule TAKE 1 CAPSULE BY MOUTH EVERY DAY  . Lancets (ACCU-CHEK SOFT TOUCH) lancets Use to check blood sugars fasting every morning and 2 hours after largest meal  . Lancets Misc. (ACCU-CHEK SOFTCLIX LANCET DEV) KIT Use to check blood sugars fasting every morning and 2 hours after largest meal  . lisinopril (ZESTRIL) 40 MG tablet TAKE 1 TABLET BY MOUTH EVERY DAY  . metFORMIN (GLUCOPHAGE) 500 MG tablet TAKE 1/2 TABELT DAILY WITH A MEAL.  . [DISCONTINUED] lisinopril (ZESTRIL) 10 MG tablet TAKE 1 TABLET BY MOUTH EVERY DAY  . [EXPIRED] cloNIDine (CATAPRES) tablet 0.1 mg    No facility-administered encounter medications on file as of 08/10/2019.     Allergies: Patient has no known allergies.  Body mass index is 25.54 kg/m.  Blood pressure (!) 158/83, pulse 80, temperature 98.9 F (37.2 C), temperature source Oral, height _0  (1.778 m), weight 178 lb (80.7 kg), SpO2 97 %.    Review of Systems  Constitutional: Positive for fatigue. Negative for activity change, appetite change, chills, diaphoresis, fever and unexpected weight change.  Eyes: Negative for visual disturbance.  Respiratory: Negative for cough, chest tightness, shortness of breath, wheezing and stridor.   Cardiovascular: Negative for chest pain, palpitations and leg swelling.  Endocrine: Negative for polydipsia, polyphagia and polyuria.  Genitourinary: Negative for difficulty urinating and flank pain.  Neurological: Positive for tremors. Negative for dizziness and headaches.  Hematological: Negative for adenopathy. Does not bruise/bleed easily.       Objective:   Physical Exam Vitals signs and nursing note reviewed.  Constitutional:       General: He is not in acute distress.    Appearance: Normal appearance. He is normal weight. He is not ill-appearing, toxic-appearing or diaphoretic.     Comments: Facial ruddiness noted   HENT:     Head: Normocephalic and atraumatic.  Cardiovascular:     Rate and Rhythm: Normal rate and regular rhythm.     Pulses: Normal pulses.     Heart sounds: Normal heart sounds. No murmur. No friction rub. No gallop.   Pulmonary:     Effort: Pulmonary effort is normal. No respiratory distress.     Breath sounds: Normal breath sounds. No stridor. No wheezing, rhonchi or rales.  Chest:     Chest wall: No tenderness.  Skin:    General: Skin is warm and dry.     Capillary Refill: Capillary refill takes less than 2 seconds.  Neurological:     Mental Status: He is alert and oriented to person, place, and time.     Motor: Tremor present.     Comments: Tremor of upper extremities noted  Psychiatric:        Mood and Affect:  Mood normal.        Behavior: Behavior normal.        Thought Content: Thought content normal.        Judgment: Judgment normal.       Assessment & Plan:   1. Diabetes mellitus without complication ([place])   2. Hypertension, unspecified type   3. Stage 3b chronic kidney disease     CKD (chronic kidney disease), stage III (HCC) CMP re-checked Lisinopril increased from 73m to 435m  Type 2 diabetes mellitus (HCC) Lab Results  Component Value Date   HGBA1C 5.9 (A) 08/10/2019   HGBA1C 5.7 (H) 05/19/2019   HGBA1C 6.2 (A) 09/09/2018  He reports AM BG 100-110s, denies episodes of hypoglycemia He is taking Metformin 50061m/2 tablet QD Mg++ level checked today  HTN (hypertension) First BP 215/124, HR 104 Advised that he proceed to nearest ED-he refused- will administer Clonidine in office and monitor. EKG- completed, reviewed with supervising physicain- no ischemic changes noted.  After resting and multiple re-checks BP 158/83, HR 80 AGAIN reviewed at length Red Flag  sx's- he denies any. He is develops any- advised to call 911- he verbalized understanding and agreement. Will discuss further cardiac work-up at f/u in 3 days    FOLLOW-UP:  Return in about 3 days (around 08/13/2019) for HTN, Evaluate Medication Effectiveness.

## 2019-08-10 NOTE — Assessment & Plan Note (Addendum)
Lab Results  Component Value Date   HGBA1C 5.9 (A) 08/10/2019   HGBA1C 5.7 (H) 05/19/2019   HGBA1C 6.2 (A) 09/09/2018  He reports AM BG 100-110s, denies episodes of hypoglycemia He is taking Metformin 500mg  1/2 tablet QD Mg++ level checked today

## 2019-08-10 NOTE — Assessment & Plan Note (Signed)
CMP re-checked Lisinopril increased from 10mg  to 40mg 

## 2019-08-10 NOTE — Patient Instructions (Addendum)
Managing Your Hypertension Hypertension is commonly called high blood pressure. This is when the force of your blood pressing against the walls of your arteries is too strong. Arteries are blood vessels that carry blood from your heart throughout your body. Hypertension forces the heart to work harder to pump blood, and may cause the arteries to become narrow or stiff. Having untreated or uncontrolled hypertension can cause heart attack, stroke, kidney disease, and other problems. What are blood pressure readings? A blood pressure reading consists of a higher number over a lower number. Ideally, your blood pressure should be below 120/80. The first ("top") number is called the systolic pressure. It is a measure of the pressure in your arteries as your heart beats. The second ("bottom") number is called the diastolic pressure. It is a measure of the pressure in your arteries as the heart relaxes. What does my blood pressure reading mean? Blood pressure is classified into four stages. Based on your blood pressure reading, your health care provider may use the following stages to determine what type of treatment you need, if any. Systolic pressure and diastolic pressure are measured in a unit called mm Hg. Normal  Systolic pressure: below 784.  Diastolic pressure: below 80. Elevated  Systolic pressure: 696-295.  Diastolic pressure: below 80. Hypertension stage 1  Systolic pressure: 284-132.  Diastolic pressure: 44-01. Hypertension stage 2  Systolic pressure: 027 or above.  Diastolic pressure: 90 or above. What health risks are associated with hypertension? Managing your hypertension is an important responsibility. Uncontrolled hypertension can lead to:  A heart attack.  A stroke.  A weakened blood vessel (aneurysm).  Heart failure.  Kidney damage.  Eye damage.  Metabolic syndrome.  Memory and concentration problems. What changes can I make to manage my hypertension?  Hypertension can be managed by making lifestyle changes and possibly by taking medicines. Your health care provider will help you make a plan to bring your blood pressure within a normal range. Eating and drinking   Eat a diet that is high in fiber and potassium, and low in salt (sodium), added sugar, and fat. An example eating plan is called the DASH (Dietary Approaches to Stop Hypertension) diet. To eat this way: ? Eat plenty of fresh fruits and vegetables. Try to fill half of your plate at each meal with fruits and vegetables. ? Eat whole grains, such as whole wheat pasta, brown rice, or whole grain bread. Fill about one quarter of your plate with whole grains. ? Eat low-fat diary products. ? Avoid fatty cuts of meat, processed or cured meats, and poultry with skin. Fill about one quarter of your plate with lean proteins such as fish, chicken without skin, beans, eggs, and tofu. ? Avoid premade and processed foods. These tend to be higher in sodium, added sugar, and fat.  Reduce your daily sodium intake. Most people with hypertension should eat less than 1,500 mg of sodium a day.  Limit alcohol intake to no more than 1 drink a day for nonpregnant women and 2 drinks a day for men. One drink equals 12 oz of beer, 5 oz of wine, or 1 oz of hard liquor. Lifestyle  Work with your health care provider to maintain a healthy body weight, or to lose weight. Ask what an ideal weight is for you.  Get at least 30 minutes of exercise that causes your heart to beat faster (aerobic exercise) most days of the week. Activities may include walking, swimming, or biking.  Include exercise  to strengthen your muscles (resistance exercise), such as weight lifting, as part of your weekly exercise routine. Try to do these types of exercises for 30 minutes at least 3 days a week.  Do not use any products that contain nicotine or tobacco, such as cigarettes and e-cigarettes. If you need help quitting, ask your health  care provider.  Control any long-term (chronic) conditions you have, such as high cholesterol or diabetes. Monitoring  Monitor your blood pressure at home as told by your health care provider. Your personal target blood pressure may vary depending on your medical conditions, your age, and other factors.  Have your blood pressure checked regularly, as often as told by your health care provider. Working with your health care provider  Review all the medicines you take with your health care provider because there may be side effects or interactions.  Talk with your health care provider about your diet, exercise habits, and other lifestyle factors that may be contributing to hypertension.  Visit your health care provider regularly. Your health care provider can help you create and adjust your plan for managing hypertension. Will I need medicine to control my blood pressure? Your health care provider may prescribe medicine if lifestyle changes are not enough to get your blood pressure under control, and if:  Your systolic blood pressure is 130 or higher.  Your diastolic blood pressure is 80 or higher. Take medicines only as told by your health care provider. Follow the directions carefully. Blood pressure medicines must be taken as prescribed. The medicine does not work as well when you skip doses. Skipping doses also puts you at risk for problems. Contact a health care provider if:  You think you are having a reaction to medicines you have taken.  You have repeated (recurrent) headaches.  You feel dizzy.  You have swelling in your ankles.  You have trouble with your vision. Get help right away if:  You develop a severe headache or confusion.  You have unusual weakness or numbness, or you feel faint.  You have severe pain in your chest or abdomen.  You vomit repeatedly.  You have trouble breathing. Summary  Hypertension is when the force of blood pumping through your arteries  is too strong. If this condition is not controlled, it may put you at risk for serious complications.  Your personal target blood pressure may vary depending on your medical conditions, your age, and other factors. For most people, a normal blood pressure is less than 120/80.  Hypertension is managed by lifestyle changes, medicines, or both. Lifestyle changes include weight loss, eating a healthy, low-sodium diet, exercising more, and limiting alcohol. This information is not intended to replace advice given to you by your health care provider. Make sure you discuss any questions you have with your health care provider. Document Released: 06/11/2012 Document Revised: 01/09/2019 Document Reviewed: 08/15/2016 Elsevier Patient Education  2020 [place] blood pressure was very high. Reviewed EKG. Please take new blood pressure medications tonight and then resume tomorrow morning. Increase Lisinopril from 10mg  to 40mg  once daily, no change to hydrochlorothiazide 12.5mg . Recent labs- stable, no change to current statin therapy. Please check blood pressure and heart rate each am/pm- bring log to follow-up in 3 days. If you develop ANY chest pain/chest pressure/shortness of breath- call 911. He verbalized understanding and agreement. Stop vodka use. Follow DASH diet. We will review labs with you at follow-up in 3 days.

## 2019-08-11 LAB — COMPREHENSIVE METABOLIC PANEL
ALT: 37 IU/L (ref 0–44)
AST: 40 IU/L (ref 0–40)
Albumin/Globulin Ratio: 1.8 (ref 1.2–2.2)
Albumin: 4.4 g/dL (ref 3.8–4.8)
Alkaline Phosphatase: 71 IU/L (ref 39–117)
BUN/Creatinine Ratio: 13 (ref 10–24)
BUN: 15 mg/dL (ref 8–27)
Bilirubin Total: 0.6 mg/dL (ref 0.0–1.2)
CO2: 25 mmol/L (ref 20–29)
Calcium: 10.2 mg/dL (ref 8.6–10.2)
Chloride: 102 mmol/L (ref 96–106)
Creatinine, Ser: 1.2 mg/dL (ref 0.76–1.27)
GFR calc Af Amer: 70 mL/min/{1.73_m2} (ref >59)
GFR calc non Af Amer: 61 mL/min/{1.73_m2} (ref >59)
Globulin, Total: 2.5 g/dL (ref 1.5–4.5)
Glucose: 148 mg/dL — ABNORMAL HIGH (ref 65–99)
Potassium: 5.9 mmol/L — ABNORMAL HIGH (ref 3.5–5.2)
Sodium: 144 mmol/L (ref 134–144)
Total Protein: 6.9 g/dL (ref 6.0–8.5)

## 2019-08-11 LAB — MAGNESIUM: Magnesium: 1.8 mg/dL (ref 1.6–2.3)

## 2019-08-11 LAB — EKG 12-LEAD

## 2019-08-12 NOTE — Progress Notes (Signed)
Subjective:    Patient ID: Jason [name], male    DOB: 03-05-1946, 73 y.o.   MRN: [id number]  HPI:05/14/2019 OV: Jason [name] is calling in for regular f/u: T2D, HLD, HTN He reports AM BG110-120s, however infrequently checks his BG at home- advised to check at least 3 times/week or if he feels any sx's of hypoglycemia He denies episodes of hypoglycemia He is currently taking Metformin 573m- 1/2 tab with breakfast CMP 06/2018 GFR 42 He is PAST due for labs- did not f/u as advised Ambulatory BP readings- SBP120-130 DBP70-90 He denies acute cardiac sx's He drinks water all day long, really cannot offer oz estimate Heestimates to consume 1-2 cocktails (vodka) 3-4 nights per week He continues to abstain from tobacco/vape use He walks daily and consistently follows diabetic diet 08/10/2019 OV: Jason [name] here for regular f/u: HTN, T2D, HLD First BP was exceedingly high- 215/124, HR 104 He reports drinking 4 vodka drinks last night-has not taken his anti-hypertensives this morning. He has steadily been increasing ETOH intake- now up to 4 nights of drinking- 2-3 vodka drinks per night. He denies CP/chest tightness/HA/dizziness/palpitations. He states "I feel just fine". Advised that he proceed to nearest ED-he refused- will administer Clonidine in office and monitor. EKG- completed, reviewed with supervising physicain- no ischemic changes noted.  Reviewed Hepatic Panel- Normal- do not recommend any change to current statin therapy. He reports AM BG 100-110s, denies episodes of hypoglycemia He is taking Metformin 509m1/2 tablet QD 08/13/2019 OV: Jason [name] here for 3 day f/u- sig elevated BP Lisinopril was increased from 1051mo 65m75meviewed ambulatory BP  SBP 100-160, mean middle 130s DBP 70-90s, mean 80s HR 70-90s He denies cardiac sx's He has stopped vodka use, denies any DT sx's He denies HA/dizziness  08/10/2019 Labs- CMP-stable, slight  increase in K+ Will decrease ACE, will re-check CMP in 4 weeks Mg++- normal   Patient Care Team    Relationship Specialty Notifications Start End  DanfMina [name] PCP - General Family Medicine  04/15/17   MillBarbaraann Cao RRock [name] Physician Optometry  06/12/17   NwobTracie Harrier Consulting Physician   08/25/18     Patient Active Problem List   Diagnosis Date Noted  . CKD (chronic kidney disease), stage III 09/09/2018  . Diabetes mellitus without complication (HCC)[place]/272/53/6644Hyperlipidemia associated with type 2 diabetes mellitus (HCC)[place]/16/2018  . Type 2 diabetes mellitus (HCC)[place]/16/2018  . HTN (hypertension) 04/15/2017  . Routine health maintenance 04/15/2017  . Fatigue 04/15/2017     Past Medical History:  Diagnosis Date  . Cataract   . Diabetes mellitus without complication (HCC)[place]. Hypertension   . Wrist injury      Past Surgical History:  Procedure Laterality Date  . APPENDECTOMY    . CATARACT EXTRACTION       Family History  Problem Relation Age of Onset  . Stroke Mother   . Arthritis Father      Social History   Substance and Sexual Activity  Drug Use No     Social History   Substance and Sexual Activity  Alcohol Use Yes  . Alcohol/week: 4.0 standard drinks  . Types: 4 Cans of beer per week     Social History   Tobacco Use  Smoking Status Never Smoker  Smokeless Tobacco Never Used     Outpatient Encounter Medications as of 08/13/2019  Medication Sig  . atorvastatin (LIPITOR) 20  MG tablet TAKE 1 TABLET BY MOUTH EVERY DAY  . blood glucose meter kit and supplies Please dispense One Touch Ultra Kit for use to check fasting blood sugars once daily  . Blood Glucose Monitoring Suppl (ACCU-CHEK AVIVA PLUS) w/Device KIT Use to check blood sugars fasting every morning and 2 hours after largest meal  . Blood Pressure KIT Use to check blood pressure twice daily  . glucose blood (ACCU-CHEK AVIVA PLUS) test strip Use to  check blood sugars fasting every morning and 2 hours after largest meal  . hydrochlorothiazide (MICROZIDE) 12.5 MG capsule TAKE 1 CAPSULE BY MOUTH EVERY DAY  . Lancets (ACCU-CHEK SOFT TOUCH) lancets Use to check blood sugars fasting every morning and 2 hours after largest meal  . Lancets Misc. (ACCU-CHEK SOFTCLIX LANCET DEV) KIT Use to check blood sugars fasting every morning and 2 hours after largest meal  . lisinopril (ZESTRIL) 30 MG tablet TAKE 1 TABLET BY MOUTH EVERY DAY  . metFORMIN (GLUCOPHAGE) 500 MG tablet TAKE 1/2 TABELT DAILY WITH A MEAL.  . [DISCONTINUED] lisinopril (ZESTRIL) 40 MG tablet TAKE 1 TABLET BY MOUTH EVERY DAY   No facility-administered encounter medications on file as of 08/13/2019.     Allergies: Patient has no known allergies.  Body mass index is 25.14 kg/m.  Blood pressure 123/74, pulse 96, temperature 99.7 F (37.6 C), temperature source Oral, height '5\' 10"'  (1.778 m), weight 175 lb 3.2 oz (79.5 kg), SpO2 98 %.  Review of Systems  Constitutional: Negative for activity change, appetite change, chills, diaphoresis, fatigue, fever and unexpected weight change.  Eyes: Negative for visual disturbance.  Respiratory: Negative for cough, chest tightness, shortness of breath, wheezing and stridor.   Cardiovascular: Negative for chest pain, palpitations and leg swelling.  Gastrointestinal: Negative for abdominal distention, anal bleeding, blood in stool, constipation, diarrhea, nausea and vomiting.  Endocrine: Negative for polydipsia, polyphagia and polyuria.  Neurological: Negative for dizziness and headaches.  Hematological: Negative for adenopathy. Does not bruise/bleed easily.  Psychiatric/Behavioral: Negative for agitation, behavioral problems, confusion, decreased concentration, dysphoric mood, hallucinations, self-injury, sleep disturbance and suicidal ideas. The patient is not nervous/anxious and is not hyperactive.        Objective:   Physical Exam Vitals  signs and nursing note reviewed.  Constitutional:      General: He is not in acute distress.    Appearance: Normal appearance. He is normal weight. He is not ill-appearing, toxic-appearing or diaphoretic.  Cardiovascular:     Rate and Rhythm: Normal rate and regular rhythm.     Pulses: Normal pulses.     Heart sounds: Normal heart sounds. No murmur. No friction rub. No gallop.   Pulmonary:     Effort: Pulmonary effort is normal. No respiratory distress.     Breath sounds: Normal breath sounds. No stridor. No wheezing, rhonchi or rales.  Chest:     Chest wall: No tenderness.  Skin:    General: Skin is warm.     Capillary Refill: Capillary refill takes less than 2 seconds.  Neurological:     Mental Status: He is alert and oriented to person, place, and time.  Psychiatric:        Mood and Affect: Mood normal.        Behavior: Behavior normal.        Thought Content: Thought content normal.        Judgment: Judgment normal.        Assessment & Plan:   1. Serum potassium elevated  2. Hypertension, unspecified type   3. Hyperlipidemia associated with type 2 diabetes mellitus (HCC)   4. Stage 3b chronic kidney disease   5. Diabetes mellitus without complication (HCC)     HTN (hypertension) BP at goal- 123/74, HR 96 Lisinopril reduced to 41m QD K+ 5.9 Continue to check BP and HR daily-record Please call clinic if BP consistently <100/60 or >140/90 or ANY cardiac symptoms develop. Follow DASH diet  Re-check CMP in 4 weeks  Hyperlipidemia associated with type 2 diabetes mellitus (HCC) Atorvastatin 259mQD  CKD (chronic kidney disease), stage III (HCRichmond11/06/2019 Labs- CMP-stable  GFR 61  Diabetes mellitus without complication (HCC) Lab Results  Component Value Date   HGBA1C 5.9 (A) 08/10/2019   HGBA1C 5.7 (H) 05/19/2019   HGBA1C 6.2 (A) 09/09/2018  08/10/2019 Mg ++  normal    FOLLOW-UP:  Return in about 3 months (around 11/13/2019) for Regular Follow Up, HTN,  Hypercholestermia, Diabetes.

## 2019-08-13 ENCOUNTER — Encounter: Payer: Self-pay | Admitting: Adult Health

## 2019-08-13 ENCOUNTER — Other Ambulatory Visit: Payer: Self-pay

## 2019-08-13 ENCOUNTER — Ambulatory Visit (INDEPENDENT_AMBULATORY_CARE_PROVIDER_SITE_OTHER): Payer: Medicare Other | Admitting: Adult Health

## 2019-08-13 VITALS — BP 123/74 | HR 96 | Temp 99.7°F | Ht 70.0 in | Wt 175.2 lb

## 2019-08-13 DIAGNOSIS — E119 Type 2 diabetes mellitus without complications: Secondary | ICD-10-CM

## 2019-08-13 DIAGNOSIS — E785 Hyperlipidemia, unspecified: Secondary | ICD-10-CM

## 2019-08-13 DIAGNOSIS — E875 Hyperkalemia: Secondary | ICD-10-CM | POA: Diagnosis not present

## 2019-08-13 DIAGNOSIS — I10 Essential (primary) hypertension: Secondary | ICD-10-CM | POA: Diagnosis not present

## 2019-08-13 DIAGNOSIS — E1169 Type 2 diabetes mellitus with other specified complication: Secondary | ICD-10-CM

## 2019-08-13 DIAGNOSIS — N1832 Chronic kidney disease, stage 3b: Secondary | ICD-10-CM | POA: Diagnosis not present

## 2019-08-13 MED ORDER — LISINOPRIL 30 MG PO TABS: ORAL_TABLET | ORAL | 1 refills | Status: DC

## 2019-08-13 NOTE — Assessment & Plan Note (Signed)
Lab Results  Component Value Date   HGBA1C 5.9 (A) 08/10/2019   HGBA1C 5.7 (H) 05/19/2019   HGBA1C 6.2 (A) 09/09/2018  08/10/2019 Mg ++  normal

## 2019-08-13 NOTE — Assessment & Plan Note (Addendum)
BP at goal- 123/74, HR 96 Lisinopril reduced to 30mg  QD K+ 5.9 Continue to check BP and HR daily-record Please call clinic if BP consistently <100/60 or >140/90 or ANY cardiac symptoms develop. Follow DASH diet  Re-check CMP in 4 weeks

## 2019-08-13 NOTE — Assessment & Plan Note (Signed)
>>  ASSESSMENT AND PLAN FOR DIABETES MELLITUS ([place]) WRITTEN ON 08/13/2019 11:14 AM BY DANFORD, KATY D, NP  Lab Results  Component Value Date   HGBA1C 5.9 (A) 08/10/2019   HGBA1C 5.7 (H) 05/19/2019   HGBA1C 6.2 (A) 09/09/2018  08/10/2019 Mg ++  normal

## 2019-08-13 NOTE — Assessment & Plan Note (Addendum)
08/10/2019 Labs- CMP-stable  GFR 61

## 2019-08-13 NOTE — Patient Instructions (Signed)
DASH Eating Plan DASH stands for "Dietary Approaches to Stop Hypertension." The DASH eating plan is a healthy eating plan that has been shown to reduce high blood pressure (hypertension). It may also reduce your risk for type 2 diabetes, heart disease, and stroke. The DASH eating plan may also help with weight loss. What are tips for following this plan?  General guidelines  Avoid eating more than 2,300 mg (milligrams) of salt (sodium) a day. If you have hypertension, you may need to reduce your sodium intake to 1,500 mg a day.  Limit alcohol intake to no more than 1 drink a day for nonpregnant women and 2 drinks a day for men. One drink equals 12 oz of beer, 5 oz of wine, or 1 oz of hard liquor.  Work with your health care provider to maintain a healthy body weight or to lose weight. Ask what an ideal weight is for you.  Get at least 30 minutes of exercise that causes your heart to beat faster (aerobic exercise) most days of the week. Activities may include walking, swimming, or biking.  Work with your health care provider or diet and nutrition specialist (dietitian) to adjust your eating plan to your individual calorie needs. Reading food labels   Check food labels for the amount of sodium per serving. Choose foods with less than 5 percent of the Daily Value of sodium. Generally, foods with less than 300 mg of sodium per serving fit into this eating plan.  To find whole grains, look for the word "whole" as the first word in the ingredient list. Shopping  Buy products labeled as "low-sodium" or "no salt added."  Buy fresh foods. Avoid canned foods and premade or frozen meals. Cooking  Avoid adding salt when cooking. Use salt-free seasonings or herbs instead of table salt or sea salt. Check with your health care provider or pharmacist before using salt substitutes.  Do not fry foods. Cook foods using healthy methods such as baking, boiling, grilling, and broiling instead.  Cook with  heart-healthy oils, such as olive, canola, soybean, or sunflower oil. Meal planning  Eat a balanced diet that includes: ? 5 or more servings of fruits and vegetables each day. At each meal, try to fill half of your plate with fruits and vegetables. ? Up to 6-8 servings of whole grains each day. ? Less than 6 oz of lean meat, poultry, or fish each day. A 3-oz serving of meat is about the same size as a deck of cards. One egg equals 1 oz. ? 2 servings of low-fat dairy each day. ? A serving of nuts, seeds, or beans 5 times each week. ? Heart-healthy fats. Healthy fats called Omega-3 fatty acids are found in foods such as flaxseeds and coldwater fish, like sardines, salmon, and mackerel.  Limit how much you eat of the following: ? Canned or prepackaged foods. ? Food that is high in trans fat, such as fried foods. ? Food that is high in saturated fat, such as fatty meat. ? Sweets, desserts, sugary drinks, and other foods with added sugar. ? Full-fat dairy products.  Do not salt foods before eating.  Try to eat at least 2 vegetarian meals each week.  Eat more home-cooked food and less restaurant, buffet, and fast food.  When eating at a restaurant, ask that your food be prepared with less salt or no salt, if possible. What foods are recommended? The items listed may not be a complete list. Talk with your dietitian about   what dietary choices are best for you. Grains Whole-grain or whole-wheat bread. Whole-grain or whole-wheat pasta. Brown rice. Oatmeal. Quinoa. Bulgur. Whole-grain and low-sodium cereals. Pita bread. Low-fat, low-sodium crackers. Whole-wheat flour tortillas. Vegetables Fresh or frozen vegetables (raw, steamed, roasted, or grilled). Low-sodium or reduced-sodium tomato and vegetable juice. Low-sodium or reduced-sodium tomato sauce and tomato paste. Low-sodium or reduced-sodium canned vegetables. Fruits All fresh, dried, or frozen fruit. Canned fruit in natural juice (without  added sugar). Meat and other protein foods Skinless chicken or turkey. Ground chicken or turkey. Pork with fat trimmed off. Fish and seafood. Egg whites. Dried beans, peas, or lentils. Unsalted nuts, nut butters, and seeds. Unsalted canned beans. Lean cuts of beef with fat trimmed off. Low-sodium, lean deli meat. Dairy Low-fat (1%) or fat-free (skim) milk. Fat-free, low-fat, or reduced-fat cheeses. Nonfat, low-sodium ricotta or cottage cheese. Low-fat or nonfat yogurt. Low-fat, low-sodium cheese. Fats and oils Soft margarine without trans fats. Vegetable oil. Low-fat, reduced-fat, or light mayonnaise and salad dressings (reduced-sodium). Canola, safflower, olive, soybean, and sunflower oils. Avocado. Seasoning and other foods Herbs. Spices. Seasoning mixes without salt. Unsalted popcorn and pretzels. Fat-free sweets. What foods are not recommended? The items listed may not be a complete list. Talk with your dietitian about what dietary choices are best for you. Grains Baked goods made with fat, such as croissants, muffins, or some breads. Dry pasta or rice meal packs. Vegetables Creamed or fried vegetables. Vegetables in a cheese sauce. Regular canned vegetables (not low-sodium or reduced-sodium). Regular canned tomato sauce and paste (not low-sodium or reduced-sodium). Regular tomato and vegetable juice (not low-sodium or reduced-sodium). Pickles. Olives. Fruits Canned fruit in a light or heavy syrup. Fried fruit. Fruit in cream or butter sauce. Meat and other protein foods Fatty cuts of meat. Ribs. Fried meat. Bacon. Sausage. Bologna and other processed lunch meats. Salami. Fatback. Hotdogs. Bratwurst. Salted nuts and seeds. Canned beans with added salt. Canned or smoked fish. Whole eggs or egg yolks. Chicken or turkey with skin. Dairy Whole or 2% milk, cream, and half-and-half. Whole or full-fat cream cheese. Whole-fat or sweetened yogurt. Full-fat cheese. Nondairy creamers. Whipped toppings.  Processed cheese and cheese spreads. Fats and oils Butter. Stick margarine. Lard. Shortening. Ghee. Bacon fat. Tropical oils, such as coconut, palm kernel, or palm oil. Seasoning and other foods Salted popcorn and pretzels. Onion salt, garlic salt, seasoned salt, table salt, and sea salt. Worcestershire sauce. Tartar sauce. Barbecue sauce. Teriyaki sauce. Soy sauce, including reduced-sodium. Steak sauce. Canned and packaged gravies. Fish sauce. Oyster sauce. Cocktail sauce. Horseradish that you find on the shelf. Ketchup. Mustard. Meat flavorings and tenderizers. Bouillon cubes. Hot sauce and Tabasco sauce. Premade or packaged marinades. Premade or packaged taco seasonings. Relishes. Regular salad dressings. Where to find more information:  National Heart, Lung, and Blood Institute: www.nhlbi.nih.gov  American Heart Association: www.heart.org Summary  The DASH eating plan is a healthy eating plan that has been shown to reduce high blood pressure (hypertension). It may also reduce your risk for type 2 diabetes, heart disease, and stroke.  With the DASH eating plan, you should limit salt (sodium) intake to 2,300 mg a day. If you have hypertension, you may need to reduce your sodium intake to 1,500 mg a day.  When on the DASH eating plan, aim to eat more fresh fruits and vegetables, whole grains, lean proteins, low-fat dairy, and heart-healthy fats.  Work with your health care provider or diet and nutrition specialist (dietitian) to adjust your eating plan to your   individual calorie needs. This information is not intended to replace advice given to you by your health care provider. Make sure you discuss any questions you have with your health care provider. Document Released: 09/06/2011 Document Revised: 08/30/2017 Document Reviewed: 09/10/2016 Elsevier Patient Education  [place].   Lisinopril reduced to 30mg  QD- new Rx sent in. Continue to check BP and HR daily-record Please call  clinic if BP consistently <100/60 or >140/90 or ANY cardiac symptoms develop. Follow DASH diet  Follow-up 3 months. Continue to social distance and wear a mask when in public. GREAT TO SEE YOU!

## 2019-08-13 NOTE — Assessment & Plan Note (Addendum)
Atorvastatin 20mg  QD

## 2019-09-01 ENCOUNTER — Other Ambulatory Visit: Payer: Self-pay | Admitting: Adult Health

## 2019-09-09 ENCOUNTER — Other Ambulatory Visit: Payer: Self-pay

## 2019-09-09 ENCOUNTER — Other Ambulatory Visit: Payer: Medicare Other

## 2019-09-09 ENCOUNTER — Telehealth: Payer: Self-pay

## 2019-09-09 DIAGNOSIS — E1169 Type 2 diabetes mellitus with other specified complication: Secondary | ICD-10-CM

## 2019-09-09 DIAGNOSIS — N1832 Chronic kidney disease, stage 3b: Secondary | ICD-10-CM

## 2019-09-09 DIAGNOSIS — Z79899 Other long term (current) drug therapy: Secondary | ICD-10-CM

## 2019-09-09 DIAGNOSIS — I10 Essential (primary) hypertension: Secondary | ICD-10-CM

## 2019-09-09 DIAGNOSIS — E785 Hyperlipidemia, unspecified: Secondary | ICD-10-CM

## 2019-09-09 DIAGNOSIS — E875 Hyperkalemia: Secondary | ICD-10-CM

## 2019-09-09 NOTE — Telephone Encounter (Signed)
Pt came in this morning for lab draw.  Pt brought home BP and HR readings with him.  Katy reviewed those readings and asked that I inform pt to continue his current medications, check BP and HR at least twice weekly and call clinic if readings are <100/60 or >140/90.  Pt expressed understanding and is agreeable.  Charyl Bigger, CMA

## 2019-09-10 ENCOUNTER — Other Ambulatory Visit: Payer: Self-pay | Admitting: Adult Health

## 2019-09-10 DIAGNOSIS — Z79899 Other long term (current) drug therapy: Secondary | ICD-10-CM

## 2019-09-10 DIAGNOSIS — E1169 Type 2 diabetes mellitus with other specified complication: Secondary | ICD-10-CM

## 2019-09-10 DIAGNOSIS — E785 Hyperlipidemia, unspecified: Secondary | ICD-10-CM

## 2019-09-10 LAB — COMPREHENSIVE METABOLIC PANEL
ALT: 18 IU/L (ref 0–44)
AST: 14 IU/L (ref 0–40)
Albumin/Globulin Ratio: 1.9 (ref 1.2–2.2)
Albumin: 4.3 g/dL (ref 3.7–4.7)
Alkaline Phosphatase: 70 IU/L (ref 39–117)
BUN/Creatinine Ratio: 13 (ref 10–24)
BUN: 16 mg/dL (ref 8–27)
Bilirubin Total: 0.9 mg/dL (ref 0.0–1.2)
CO2: 24 mmol/L (ref 20–29)
Calcium: 10.1 mg/dL (ref 8.6–10.2)
Chloride: 96 mmol/L (ref 96–106)
Creatinine, Ser: 1.22 mg/dL (ref 0.76–1.27)
GFR calc Af Amer: 69 mL/min/{1.73_m2} (ref >59)
GFR calc non Af Amer: 59 mL/min/{1.73_m2} — ABNORMAL LOW (ref >59)
Globulin, Total: 2.3 g/dL (ref 1.5–4.5)
Glucose: 181 mg/dL — ABNORMAL HIGH (ref 65–99)
Potassium: 4.6 mmol/L (ref 3.5–5.2)
Sodium: 136 mmol/L (ref 134–144)
Total Protein: 6.6 g/dL (ref 6.0–8.5)

## 2019-09-10 LAB — LIPID PANEL
Chol/HDL Ratio: 3.5 ratio (ref 0.0–5.0)
Cholesterol, Total: 170 mg/dL (ref 100–199)
HDL: 48 mg/dL (ref >39)
LDL Chol Calc (NIH): 100 mg/dL — ABNORMAL HIGH (ref 0–99)
Triglycerides: 120 mg/dL (ref 0–149)
VLDL Cholesterol Cal: 22 mg/dL (ref 5–40)

## 2019-09-10 MED ORDER — ATORVASTATIN CALCIUM 40 MG PO TABS: ORAL_TABLET | ORAL | 0 refills | Status: DC

## 2019-10-22 ENCOUNTER — Other Ambulatory Visit: Payer: Self-pay

## 2019-10-22 ENCOUNTER — Other Ambulatory Visit: Payer: Medicare Other

## 2019-10-22 DIAGNOSIS — E1169 Type 2 diabetes mellitus with other specified complication: Secondary | ICD-10-CM

## 2019-10-22 DIAGNOSIS — Z79899 Other long term (current) drug therapy: Secondary | ICD-10-CM

## 2019-10-23 ENCOUNTER — Other Ambulatory Visit: Payer: Self-pay | Admitting: Adult Health

## 2019-10-23 LAB — LIPID PANEL
Chol/HDL Ratio: 2.7 ratio (ref 0.0–5.0)
Cholesterol, Total: 162 mg/dL (ref 100–199)
HDL: 60 mg/dL (ref >39)
LDL Chol Calc (NIH): 90 mg/dL (ref 0–99)
Triglycerides: 59 mg/dL (ref 0–149)
VLDL Cholesterol Cal: 12 mg/dL (ref 5–40)

## 2019-10-23 LAB — HEPATIC FUNCTION PANEL
ALT: 20 IU/L (ref 0–44)
AST: 24 IU/L (ref 0–40)
Albumin: 4.6 g/dL (ref 3.7–4.7)
Alkaline Phosphatase: 64 IU/L (ref 39–117)
Bilirubin Total: 0.8 mg/dL (ref 0.0–1.2)
Bilirubin, Direct: 0.3 mg/dL (ref 0.00–0.40)
Total Protein: 7 g/dL (ref 6.0–8.5)

## 2019-10-23 MED ORDER — ATORVASTATIN CALCIUM 80 MG PO TABS: ORAL_TABLET | ORAL | 0 refills | Status: DC

## 2019-11-01 ENCOUNTER — Other Ambulatory Visit: Payer: Self-pay | Admitting: Adult Health

## 2019-11-02 ENCOUNTER — Other Ambulatory Visit: Payer: Self-pay | Admitting: Adult Health

## 2019-11-05 ENCOUNTER — Other Ambulatory Visit: Payer: Self-pay | Admitting: Adult Health

## 2019-11-05 ENCOUNTER — Other Ambulatory Visit: Payer: Self-pay

## 2019-11-05 MED ORDER — ATORVASTATIN CALCIUM 80 MG PO TABS: ORAL_TABLET | ORAL | 0 refills | Status: DC

## 2019-11-09 NOTE — Progress Notes (Signed)
Subjective:    Patient ID: Jason [name], male    DOB: [birth year], 74 y.o.   MRN: [id number]  HPI:  Mr. Raper is here for regular f/u:T2D, HTN. HLD Ambulatory BP SBP 120-150 DBP 70-90, mean mid 80s HR 80-low 90s He denies dizziness with position changes. He denies chest pain or tightness with exertion. He is currently taking Lisinopril 23m QD, HCTZ 12.592mQD He continues to abstain from tobacco/vape use. He has further reduce ETOH use- now only enjoys vodka cocktails on weekends (maybe 2-4). He has not been checking BG, denies episodes of hypoglycemia He is taking Metformin 50049m/2 tab QD Lab Results  Component Value Date   HGBA1C 6.1 (A) 11/10/2019   HGBA1C 5.9 (A) 08/10/2019   HGBA1C 5.7 (H) 05/19/2019   LDL was above goal at last lipid check 09/09/2019- LDL 100 Atorvastatin increased to 1m6m Lipids/hepatic fx panel drawn today   Patient Care Team    Relationship Specialty Notifications Start End  DanfMina MarbleNP PCP - General Family Medicine  04/15/17   MillBarbaraann Cao [facility] [place] [facility]  06/12/17   NwobTracie Harrier Consulting Physician   08/25/18     Patient Active Problem List   Diagnosis Date Noted  . CKD (chronic kidney disease), stage III 09/09/2018  . 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 Type 2 diabetes mellitus (HCC)[place]/16/2018  . HTN (hypertension) 04/15/2017  . Routine health maintenance 04/15/2017  . Fatigue 04/15/2017     Past Medical History:  Diagnosis Date  . Cataract   . Diabetes mellitus without complication (HCC)[place]. Hypertension   . Wrist injury      Past Surgical History:  Procedure Laterality Date  . APPENDECTOMY    . CATARACT EXTRACTION       Family History  Problem Relation Age of Onset  . Stroke Mother   . Arthritis Father      Social History   Substance and Sexual Activity   Drug Use No     Social History   Substance and Sexual Activity  Alcohol Use Yes  . Alcohol/week: 4.0 standard drinks  . Types: 4 Cans of beer per week     Social History   Tobacco Use  Smoking Status Never Smoker  Smokeless Tobacco Never Used     Outpatient Encounter Medications as of 11/10/2019  Medication Sig  . atorvastatin (LIPITOR) 80 MG tablet TAKE 1 TABLET BY MOUTH EVERY DAY  . blood glucose meter kit and supplies Please dispense One Touch Ultra Kit for use to check fasting blood sugars once daily  . Blood Glucose Monitoring Suppl (ACCU-CHEK AVIVA PLUS) w/Device KIT Use to check blood sugars fasting every morning and 2 hours after largest meal  . Blood Pressure KIT Use to check blood pressure twice daily  . glucose blood (ACCU-CHEK AVIVA PLUS) test strip Use to check blood sugars fasting every morning and 2 hours after largest meal  . hydrochlorothiazide (MICROZIDE) 12.5 MG capsule TAKE 1 CAPSULE BY MOUTH EVERY DAY  . Lancets (ACCU-CHEK SOFT TOUCH) lancets Use to check blood sugars fasting every morning and 2 hours after largest meal  . Lancets Misc. (ACCU-CHEK SOFTCLIX LANCET DEV) KIT Use to check blood sugars fasting every morning and 2 hours after largest meal  . lisinopril (ZESTRIL) 30 MG tablet TAKE 1 TABLET BY MOUTH EVERY DAY  . metFORMIN (GLUCOPHAGE) 500 MG tablet  TAKE 1/2 TABLET BY MOUTH WITH A MEAL   No facility-administered encounter medications on file as of 11/10/2019.    Allergies: Patient has no known allergies.  Body mass index is 25.25 kg/m.  Blood pressure 125/76, pulse 99, temperature 98.1 F (36.7 C), temperature source Oral, height '5\' 10"'  (1.778 m), weight 176 lb (79.8 kg), SpO2 96 %.     Review of Systems  Constitutional: Negative for activity change, appetite change, chills, diaphoresis, fatigue, fever and unexpected weight change.  HENT: Negative for congestion.   Eyes: Negative for visual disturbance.  Respiratory: Negative for cough,  chest tightness, shortness of breath, wheezing and stridor.   Cardiovascular: Negative for chest pain, palpitations and leg swelling.  Gastrointestinal: Negative for abdominal distention, abdominal pain, blood in stool, constipation, diarrhea, nausea and vomiting.  Endocrine: Negative for polydipsia, polyphagia and polyuria.  Genitourinary: Negative for difficulty urinating and flank pain.  Musculoskeletal: Negative for arthralgias and myalgias.  Neurological: Negative for dizziness.  Hematological: Negative for adenopathy. Does not bruise/bleed easily.  Psychiatric/Behavioral: Negative for agitation, behavioral problems, confusion, decreased concentration, dysphoric mood, hallucinations, self-injury, sleep disturbance and suicidal ideas. The patient is not nervous/anxious and is not hyperactive.        Objective:   Physical Exam Vitals and nursing note reviewed.  Constitutional:      General: He is not in acute distress.    Appearance: Normal appearance. He is normal weight. He is not ill-appearing, toxic-appearing or diaphoretic.  HENT:     Head: Normocephalic and atraumatic.  Eyes:     Extraocular Movements: Extraocular movements intact.     Conjunctiva/sclera: Conjunctivae normal.     Pupils: Pupils are equal, round, and reactive to light.  Cardiovascular:     Rate and Rhythm: Normal rate and regular rhythm.     Pulses: Normal pulses.     Heart sounds: Normal heart sounds. No murmur. No friction rub. No gallop.   Pulmonary:     Effort: Pulmonary effort is normal. No respiratory distress.     Breath sounds: Normal breath sounds. No stridor. No wheezing, rhonchi or rales.  Chest:     Chest wall: No tenderness.  Skin:    General: Skin is warm and dry.     Capillary Refill: Capillary refill takes less than 2 seconds.  Neurological:     Mental Status: He is alert and oriented to person, place, and time.  Psychiatric:        Mood and Affect: Mood normal.        Behavior:  Behavior normal.        Thought Content: Thought content normal.        Judgment: Judgment normal.        Assessment & Plan:   1. Hyperlipidemia associated with type 2 diabetes mellitus ([place])   2. Diabetes mellitus without complication ([place])   3. On statin therapy   4. Routine health maintenance   5. Hypertension, unspecified type     Routine health maintenance A1c-6.1 Blood pressure at goal. Continue to check blood pressure (BP) and heart rate (HR) daily. Call clinic if your ever experience dizziness with position changes or BP consistently <110/60 or >140/90. Recommend checking fasting blood glucose several times per week- goal <150. We will contact you with lab results. It has been an absolute pleasure working with you the last few years. Follow-up with primary care in 3 months.  Hyperlipidemia associated with type 2 diabetes mellitus (HCC)  LDL was above goal at last  lipid check 09/09/2019- LDL 100 Atorvastatin increased to 88m QD Lipids/hepatic fx panel drawn today   [place] Lab Results  Component Value Date   HGBA1C 6.1 (A) 11/10/2019   HGBA1C 5.9 (A) 08/10/2019   HGBA1C 5.7 (H) 05/19/2019  He has not been checking BG, denies episodes of hypoglycemia He is taking Metformin 5050m1/2 tab QD Recommend checking fasting blood glucose several times per week- goal <150. Microalbumin is normal  HTN (hypertension) Ambulatory BP SBP 120-150 DBP 70-90, mean mid 80s HR 80-low 90s He denies dizziness with position changes. He denies chest pain or tightness with exertion. He is currently taking Lisinopril 3094mD, HCTZ 12.5mg50m Continue to check blood pressure (BP) and heart rate (HR) daily. Call clinic if your ever experience dizziness with position changes or BP consistently <110/60 or >140/90.    FOLLOW-UP:  Return in about 3 months (around 02/07/2020) for Regular Follow Up, HTN, Hypercholestermia, Diabetes.

## 2019-11-10 ENCOUNTER — Encounter: Payer: Self-pay | Admitting: Adult Health

## 2019-11-10 ENCOUNTER — Ambulatory Visit (INDEPENDENT_AMBULATORY_CARE_PROVIDER_SITE_OTHER): Payer: Medicare Other | Admitting: Adult Health

## 2019-11-10 ENCOUNTER — Other Ambulatory Visit: Payer: Self-pay

## 2019-11-10 VITALS — BP 125/76 | HR 99 | Temp 98.1°F | Ht 70.0 in | Wt 176.0 lb

## 2019-11-10 DIAGNOSIS — Z79899 Other long term (current) drug therapy: Secondary | ICD-10-CM | POA: Diagnosis not present

## 2019-11-10 DIAGNOSIS — Z0000 Encounter for general adult medical examination without abnormal findings: Secondary | ICD-10-CM | POA: Diagnosis not present

## 2019-11-10 DIAGNOSIS — E119 Type 2 diabetes mellitus without complications: Secondary | ICD-10-CM | POA: Diagnosis not present

## 2019-11-10 DIAGNOSIS — E1169 Type 2 diabetes mellitus with other specified complication: Secondary | ICD-10-CM

## 2019-11-10 DIAGNOSIS — I10 Essential (primary) hypertension: Secondary | ICD-10-CM

## 2019-11-10 DIAGNOSIS — E785 Hyperlipidemia, unspecified: Secondary | ICD-10-CM

## 2019-11-10 LAB — POCT GLYCOSYLATED HEMOGLOBIN (HGB A1C): Hemoglobin A1C: 6.1 % — AB (ref 4.0–5.6)

## 2019-11-10 LAB — POCT UA - MICROALBUMIN
Albumin/Creatinine Ratio, Urine, POC: <30
Creatinine, POC: 300 mg/dL
Microalbumin Ur, POC: 30 mg/L

## 2019-11-10 NOTE — Assessment & Plan Note (Signed)
A1c-6.1 Blood pressure at goal. Continue to check blood pressure (BP) and heart rate (HR) daily. Call clinic if your ever experience dizziness with position changes or BP consistently <110/60 or >140/90. Recommend checking fasting blood glucose several times per week- goal <150. We will contact you with lab results. It has been an absolute pleasure working with you the last few years. Follow-up with primary care in 3 months.

## 2019-11-10 NOTE — Assessment & Plan Note (Signed)
Ambulatory BP SBP 120-150 DBP 70-90, mean mid 80s HR 80-low 90s He denies dizziness with position changes. He denies chest pain or tightness with exertion. He is currently taking Lisinopril 30mg  QD, HCTZ 12.5mg  QD Continue to check blood pressure (BP) and heart rate (HR) daily. Call clinic if your ever experience dizziness with position changes or BP consistently <110/60 or >140/90.

## 2019-11-10 NOTE — Assessment & Plan Note (Signed)
  LDL was above goal at last lipid check 09/09/2019- LDL 100 Atorvastatin increased to 80mg  QD Lipids/hepatic fx panel drawn today

## 2019-11-10 NOTE — Patient Instructions (Addendum)
Diabetes Mellitus and Nutrition, Adult When you have diabetes (diabetes mellitus), it is very important to have healthy eating habits because your blood sugar (glucose) levels are greatly affected by what you eat and drink. Eating healthy foods in the appropriate amounts, at about the same times every day, can help you:  Control your blood glucose.  Lower your risk of heart disease.  Improve your blood pressure.  Reach or maintain a healthy weight. Every person with diabetes is different, and each person has different needs for a meal plan. Your health care provider may recommend that you work with a diet and nutrition specialist (dietitian) to make a meal plan that is best for you. Your meal plan may vary depending on factors such as:  The calories you need.  The medicines you take.  Your weight.  Your blood glucose, blood pressure, and cholesterol levels.  Your activity level.  Other health conditions you have, such as heart or kidney disease. How do carbohydrates affect me? Carbohydrates, also called carbs, affect your blood glucose level more than any other type of food. Eating carbs naturally raises the amount of glucose in your blood. Carb counting is a method for keeping track of how many carbs you eat. Counting carbs is important to keep your blood glucose at a healthy level, especially if you use insulin or take certain oral diabetes medicines. It is important to know how many carbs you can safely have in each meal. This is different for every person. Your dietitian can help you calculate how many carbs you should have at each meal and for each snack. Foods that contain carbs include:  Bread, cereal, rice, pasta, and crackers.  Potatoes and corn.  Peas, beans, and lentils.  Milk and yogurt.  Fruit and juice.  Desserts, such as cakes, cookies, ice cream, and candy. How does alcohol affect me? Alcohol can cause a sudden decrease in blood glucose (hypoglycemia),  especially if you use insulin or take certain oral diabetes medicines. Hypoglycemia can be a life-threatening condition. Symptoms of hypoglycemia (sleepiness, dizziness, and confusion) are similar to symptoms of having too much alcohol. If your health care provider says that alcohol is safe for you, follow these guidelines:  Limit alcohol intake to no more than 1 drink per day for nonpregnant women and 2 drinks per day for men. One drink equals 12 oz of beer, 5 oz of wine, or 1 oz of hard liquor.  Do not drink on an empty stomach.  Keep yourself hydrated with water, diet soda, or unsweetened iced tea.  Keep in mind that regular soda, juice, and other mixers may contain a lot of sugar and must be counted as carbs. What are tips for following this plan?  Reading food labels  Start by checking the serving size on the "Nutrition Facts" label of packaged foods and drinks. The amount of calories, carbs, fats, and other nutrients listed on the label is based on one serving of the item. Many items contain more than one serving per package.  Check the total grams (g) of carbs in one serving. You can calculate the number of servings of carbs in one serving by dividing the total carbs by 15. For example, if a food has 30 g of total carbs, it would be equal to 2 servings of carbs.  Check the number of grams (g) of saturated and trans fats in one serving. Choose foods that have low or no amount of these fats.  Check the number of   milligrams (mg) of salt (sodium) in one serving. Most people should limit total sodium intake to less than 2,300 mg per day.  Always check the nutrition information of foods labeled as "low-fat" or "nonfat". These foods may be higher in added sugar or refined carbs and should be avoided.  Talk to your dietitian to identify your daily goals for nutrients listed on the label. Shopping  Avoid buying canned, premade, or processed foods. These foods tend to be high in fat, sodium,  and added sugar.  Shop around the outside edge of the grocery store. This includes fresh fruits and vegetables, bulk grains, fresh meats, and fresh dairy. Cooking  Use low-heat cooking methods, such as baking, instead of high-heat cooking methods like deep frying.  Cook using healthy oils, such as olive, canola, or sunflower oil.  Avoid cooking with butter, cream, or high-fat meats. Meal planning  Eat meals and snacks regularly, preferably at the same times every day. Avoid going long periods of time without eating.  Eat foods high in fiber, such as fresh fruits, vegetables, beans, and whole grains. Talk to your dietitian about how many servings of carbs you can eat at each meal.  Eat 4-6 ounces (oz) of lean protein each day, such as lean meat, chicken, fish, eggs, or tofu. One oz of lean protein is equal to: ? 1 oz of meat, chicken, or fish. ? 1 egg. ?  cup of tofu.  Eat some foods each day that contain healthy fats, such as avocado, nuts, seeds, and fish. Lifestyle  Check your blood glucose regularly.  Exercise regularly as told by your health care provider. This may include: ? 150 minutes of moderate-intensity or vigorous-intensity exercise each week. This could be brisk walking, biking, or water aerobics. ? Stretching and doing strength exercises, such as yoga or weightlifting, at least 2 times a week.  Take medicines as told by your health care provider.  Do not use any products that contain nicotine or tobacco, such as cigarettes and e-cigarettes. If you need help quitting, ask your health care provider.  Work with a Social worker or diabetes educator to identify strategies to manage stress and any emotional and social challenges. Questions to ask a health care provider  Do I need to meet with a diabetes educator?  Do I need to meet with a dietitian?  What number can I call if I have questions?  When are the best times to check my blood glucose? Where to find more  information:  American Diabetes Association: diabetes.org  Academy of Nutrition and Dietetics: www.eatright.[employer] of Diabetes and Digestive and Kidney Diseases (NIH): [employer] Summary  A healthy meal plan will help you control your blood glucose and maintain a healthy lifestyle.  Working with a diet and nutrition specialist (dietitian) can help you make a meal plan that is best for you.  Keep in mind that carbohydrates (carbs) and alcohol have immediate effects on your blood glucose levels. It is important to count carbs and to use alcohol carefully. This information is not intended to replace advice given to you by your health care provider. Make sure you discuss any questions you have with your health care provider. Document Revised: 08/30/2017 Document Reviewed: 10/22/2016 Elsevier Patient Education  2020 [place].  A1c-6.1 Blood pressure at goal. Continue to check blood pressure (BP) and heart rate (HR) daily. Call clinic if your ever experience dizziness with position changes or BP consistently <110/60 or >140/90. Recommend checking fasting blood glucose several  times per week- goal <150. We will contact you with lab results. It has been an absolute pleasure working with you the last few years. Follow-up with primary care in 3 months. GREAT TO SEE YOU!

## 2019-11-10 NOTE — Assessment & Plan Note (Addendum)
Lab Results  Component Value Date   HGBA1C 6.1 (A) 11/10/2019   HGBA1C 5.9 (A) 08/10/2019   HGBA1C 5.7 (H) 05/19/2019  He has not been checking BG, denies episodes of hypoglycemia He is taking Metformin 500mg  1/2 tab QD Recommend checking fasting blood glucose several times per week- goal <150. Microalbumin is normal

## 2019-11-11 LAB — HEPATIC FUNCTION PANEL
ALT: 25 IU/L (ref 0–44)
AST: 24 IU/L (ref 0–40)
Albumin: 4.5 g/dL (ref 3.7–4.7)
Alkaline Phosphatase: 66 IU/L (ref 39–117)
Bilirubin Total: 0.9 mg/dL (ref 0.0–1.2)
Bilirubin, Direct: 0.27 mg/dL (ref 0.00–0.40)
Total Protein: 6.8 g/dL (ref 6.0–8.5)

## 2019-11-11 LAB — LIPID PANEL
Chol/HDL Ratio: 2.7 ratio (ref 0.0–5.0)
Cholesterol, Total: 156 mg/dL (ref 100–199)
HDL: 57 mg/dL (ref >39)
LDL Chol Calc (NIH): 82 mg/dL (ref 0–99)
Triglycerides: 94 mg/dL (ref 0–149)
VLDL Cholesterol Cal: 17 mg/dL (ref 5–40)

## 2019-11-16 ENCOUNTER — Ambulatory Visit: Payer: Medicare Other | Admitting: Adult Health

## 2019-12-02 ENCOUNTER — Other Ambulatory Visit: Payer: Self-pay | Admitting: Adult Health

## 2020-01-25 ENCOUNTER — Other Ambulatory Visit: Payer: Self-pay | Admitting: Adult Health

## 2020-01-28 ENCOUNTER — Other Ambulatory Visit: Payer: Self-pay | Admitting: Adult Health

## 2020-02-04 ENCOUNTER — Ambulatory Visit: Payer: Medicare Other | Admitting: Physician Assistant

## 2020-02-11 ENCOUNTER — Other Ambulatory Visit: Payer: Self-pay | Admitting: Adult Health

## 2020-02-21 NOTE — Progress Notes (Signed)
Established Patient Office Visit  Subjective:  Patient ID: Jason [name], male    DOB: 1946/04/20  Age: 74 y.o. MRN: [id number]  CC: No chief complaint on file.   HPI Jason [name] presents for 3 month chronic follow-up on T2DM, hyperlipdemia, and hypertension.  Diabetes: Pt denies increased urination or thirst. Pt reports medication compliance. No hypoglycemic events. Checking glucose at home occasionally. FBS range 100-120s.  HLD: Pt taking medication as directed without issues. Denies side effects including myalgias and RUQ pain.   HTN: Pt denies chest pain, palpitations, dizziness or lower extremity swelling. Taking medication as directed without side effects. Checks BP at home daily and brings log with readings ranging in 110s-130s/70s-80s. Pt follows a low salt diet.   Past Medical History:  Diagnosis Date  . Cataract   . Diabetes mellitus without complication ([place])   . Hypertension   . Wrist injury     Past Surgical History:  Procedure Laterality Date  . APPENDECTOMY    . CATARACT EXTRACTION      Family History  Problem Relation Age of Onset  . Stroke Mother   . Arthritis Father     Social History   Socioeconomic History  . Marital status: Married    Spouse name: Not on file  . Number of children: Not on file  . Years of education: Not on file  . Highest education level: Not on file  Occupational History  . Not on file  Tobacco Use  . Smoking status: Never Smoker  . Smokeless tobacco: Never Used  Substance and Sexual Activity  . Alcohol use: Yes    Alcohol/week: 4.0 standard drinks    Types: 4 Cans of beer per week  . Drug use: No  . Sexual activity: Never    Partners: Female  Other Topics Concern  . Not on file  Social History Narrative  . Not on file   Social Determinants of Health   Financial Resource Strain:   . Difficulty of Paying Living Expenses:   Food Insecurity:   . Worried About Charity fundraiser in the Last  Year:   . Arboriculturist in the Last Year:   Transportation Needs:   . Film/video editor (Medical):   Marland Kitchen Lack of Transportation (Non-Medical):   Physical Activity:   . Days of Exercise per Week:   . Minutes of Exercise per Session:   Stress:   . Feeling of Stress :   Social Connections:   . Frequency of Communication with Friends and Family:   . Frequency of Social Gatherings with Friends and Family:   . Attends Religious Services:   . Active Member of Clubs or Organizations:   . Attends Archivist Meetings:   Marland Kitchen Marital Status:   Intimate Partner Violence:   . Fear of Current or Ex-Partner:   . Emotionally Abused:   Marland Kitchen Physically Abused:   . Sexually Abused:     Outpatient Medications Prior to Visit  Medication Sig Dispense Refill  . atorvastatin (LIPITOR) 80 MG tablet TAKE 1 TABLET BY MOUTH EVERY DAY 90 tablet 0  . blood glucose meter kit and supplies Please dispense One Touch Ultra Kit for use to check fasting blood sugars once daily 1 each 0  . Blood Glucose Monitoring Suppl (ACCU-CHEK AVIVA PLUS) w/Device KIT Use to check blood sugars fasting every morning and 2 hours after largest meal 1 kit 0  . Blood Pressure KIT Use to check blood pressure  twice daily 1 each 0  . glucose blood (ACCU-CHEK AVIVA PLUS) test strip Use to check blood sugars fasting every morning and 2 hours after largest meal 200 each 12  . hydrochlorothiazide (MICROZIDE) 12.5 MG capsule TAKE 1 CAPSULE BY MOUTH EVERY DAY 90 capsule 0  . Lancets (ACCU-CHEK SOFT TOUCH) lancets Use to check blood sugars fasting every morning and 2 hours after largest meal 200 each 12  . Lancets Misc. (ACCU-CHEK SOFTCLIX LANCET DEV) KIT Use to check blood sugars fasting every morning and 2 hours after largest meal 1 kit 0  . lisinopril (ZESTRIL) 30 MG tablet TAKE 1 TABLET BY MOUTH EVERY DAY 90 tablet 1  . metFORMIN (GLUCOPHAGE) 500 MG tablet TAKE 1/2 TABLET BY MOUTH WITH A MEAL 45 tablet 0   No facility-administered  medications prior to visit.    No Known Allergies  ROS Review of Systems  A fourteen system review of systems was performed and found to be positive as per HPI.   Objective:    Physical Exam General:  Well Developed, well nourished, appropriate for stated age.  Neuro:  Alert and oriented,  extra-ocular muscles intact  HEENT:  Normocephalic, atraumatic Skin:  no gross rash, warm, pink. Cardiac:  RRR, S1 S2 Respiratory:  ECTA B/L and A/P, Not using accessory muscles, speaking in full sentences- unlabored. Vascular:  Ext warm, no cyanosis apprec. Psych:  No HI/SI, judgement and insight good, Euthymic mood. Full Affect.  There were no vitals taken for this visit. Wt Readings from Last 3 Encounters:  11/10/19 176 lb (79.8 kg)  08/13/19 175 lb 3.2 oz (79.5 kg)  08/10/19 178 lb (80.7 kg)     Health Maintenance Due  Topic Date Due  . Hepatitis C Screening  Never done  . COVID-19 Vaccine (1) Never done  . COLONOSCOPY  01/18/2018  . PNA vac Low Risk Adult (2 of 2 - PPSV23) 10/14/2018  . OPHTHALMOLOGY EXAM  12/04/2018    There are no preventive care reminders to display for this patient.  Lab Results  Component Value Date   TSH 1.320 05/19/2019   Lab Results  Component Value Date   WBC 7.5 05/19/2019   HGB 15.6 05/19/2019   HCT 43.3 05/19/2019   MCV 102 (H) 05/19/2019   PLT 196 05/19/2019   Lab Results  Component Value Date   NA 136 09/09/2019   K 4.6 09/09/2019   CO2 24 09/09/2019   GLUCOSE 181 (H) 09/09/2019   BUN 16 09/09/2019   CREATININE 1.22 09/09/2019   BILITOT 0.9 11/10/2019   ALKPHOS 66 11/10/2019   AST 24 11/10/2019   ALT 25 11/10/2019   PROT 6.8 11/10/2019   ALBUMIN 4.5 11/10/2019   CALCIUM 10.1 09/09/2019   Lab Results  Component Value Date   CHOL 156 11/10/2019   Lab Results  Component Value Date   HDL 57 11/10/2019   Lab Results  Component Value Date   LDLCALC 82 11/10/2019   Lab Results  Component Value Date   TRIG 94 11/10/2019    Lab Results  Component Value Date   CHOLHDL 2.7 11/10/2019   Lab Results  Component Value Date   HGBA1C 6.1 (A) 11/10/2019      Assessment & Plan:   Problem List Items Addressed This Visit      Cardiovascular and Mediastinum   HTN (hypertension)     Endocrine   Hyperlipidemia associated with type 2 diabetes mellitus ([place])   Type 2 diabetes mellitus ([place]) - Primary  Genitourinary   CKD (chronic kidney disease), stage III     Type 2 Diabetes Mellitus: - A1c today is 6.6, stable - Continue Metformin 500 mg 0.5 tablet. - Encourage ambulatory glucose monitoring and notify clinic if FBS consistently <80 or >160. - Encourage to follow low glucose/carbohydrate diet and stay as active as possible.  HTN: - BP today is 142/84, stable. - Continue Lisinopril and HCTZ. - Continue ambulatory BP and pulse monitoring and keep a log. - Continue DASH diet.  HLD: - Last lipid panel wnl's. - Continue Lipitor 80 mg. - Follow heart healthy diet. - Will recheck lipid panel and hepatic function next OV.  CKD stage III : - Last CMP: creatinine 1.22 and GFR 59, stable - Will recheck kidney function today.  No orders of the defined types were placed in this encounter.  Follow-up in 3 months with [facility]  Follow-up: No follow-ups on file.    Lorrene Reid, PA-C

## 2020-02-22 ENCOUNTER — Encounter: Payer: Self-pay | Admitting: Physician Assistant

## 2020-02-22 ENCOUNTER — Other Ambulatory Visit: Payer: Self-pay

## 2020-02-22 ENCOUNTER — Ambulatory Visit (INDEPENDENT_AMBULATORY_CARE_PROVIDER_SITE_OTHER): Payer: Medicare Other | Admitting: Physician Assistant

## 2020-02-22 VITALS — BP 142/84 | HR 114 | Temp 98.8°F | Ht 70.0 in | Wt 175.0 lb

## 2020-02-22 DIAGNOSIS — E785 Hyperlipidemia, unspecified: Secondary | ICD-10-CM

## 2020-02-22 DIAGNOSIS — N1832 Chronic kidney disease, stage 3b: Secondary | ICD-10-CM | POA: Diagnosis not present

## 2020-02-22 DIAGNOSIS — E1169 Type 2 diabetes mellitus with other specified complication: Secondary | ICD-10-CM | POA: Diagnosis not present

## 2020-02-22 DIAGNOSIS — I10 Essential (primary) hypertension: Secondary | ICD-10-CM | POA: Diagnosis not present

## 2020-02-22 LAB — POCT GLYCOSYLATED HEMOGLOBIN (HGB A1C): Hemoglobin A1C: 6.6 % — AB (ref 4.0–5.6)

## 2020-02-22 MED ORDER — LISINOPRIL 30 MG PO TABS: ORAL_TABLET | ORAL | 1 refills | Status: DC

## 2020-02-22 NOTE — Patient Instructions (Signed)

## 2020-02-23 ENCOUNTER — Other Ambulatory Visit: Payer: Self-pay | Admitting: Physician Assistant

## 2020-02-23 LAB — COMPREHENSIVE METABOLIC PANEL
ALT: 41 IU/L (ref 0–44)
AST: 45 IU/L — ABNORMAL HIGH (ref 0–40)
Albumin/Globulin Ratio: 1.7 (ref 1.2–2.2)
Albumin: 4.3 g/dL (ref 3.7–4.7)
Alkaline Phosphatase: 78 IU/L (ref 48–121)
BUN/Creatinine Ratio: 19 (ref 10–24)
BUN: 20 mg/dL (ref 8–27)
Bilirubin Total: 0.6 mg/dL (ref 0.0–1.2)
CO2: 22 mmol/L (ref 20–29)
Calcium: 9.8 mg/dL (ref 8.6–10.2)
Chloride: 99 mmol/L (ref 96–106)
Creatinine, Ser: 1.08 mg/dL (ref 0.76–1.27)
GFR calc Af Amer: 79 mL/min/{1.73_m2} (ref >59)
GFR calc non Af Amer: 69 mL/min/{1.73_m2} (ref >59)
Globulin, Total: 2.6 g/dL (ref 1.5–4.5)
Glucose: 119 mg/dL — ABNORMAL HIGH (ref 65–99)
Potassium: 4.6 mmol/L (ref 3.5–5.2)
Sodium: 138 mmol/L (ref 134–144)
Total Protein: 6.9 g/dL (ref 6.0–8.5)

## 2020-02-25 ENCOUNTER — Telehealth: Payer: Self-pay | Admitting: Physician Assistant

## 2020-02-25 ENCOUNTER — Other Ambulatory Visit: Payer: Self-pay | Admitting: Physician Assistant

## 2020-02-25 MED ORDER — HYDROCHLOROTHIAZIDE 12.5 MG PO CAPS: 12.5000 mg | ORAL_CAPSULE | Freq: Every day | ORAL | 0 refills | Status: DC

## 2020-02-25 NOTE — Telephone Encounter (Signed)
Pt informed of new RX sent to pharmacy.  Charyl Bigger, CMA

## 2020-02-25 NOTE — Telephone Encounter (Signed)
Patient called stating he went to pick up all his pending refills at [facility] on [address]. He signed for the order and when he got home he realized his hydrochlorothiazide was not in the bag. He called the pharm and they said they did not receive an order for this med. I advised the patient that we sent a [facility] mnth refill to [facility] April, and that I would send a message to nurse to review order accuracy.

## 2020-02-25 NOTE — Telephone Encounter (Signed)
See additional phone message from today.  Charyl Bigger, CMA

## 2020-02-25 NOTE — Telephone Encounter (Signed)
02/25/2020  Confirmed with Alex at [facility] that HCTZ RX was filled and picked up on 01/25/2020 for 90 day supply.  Therefore, it is too early for any additional refills.  LVM for pt to return call to discuss.  Charyl Bigger, CMA

## 2020-02-25 NOTE — Telephone Encounter (Signed)
LVM for pt to call to discuss.  T. Master Touchet, CMA  

## 2020-02-25 NOTE — Telephone Encounter (Signed)
Patient has called twice about medication says , he is sure this Rx / HCTZ was not in the bag he got from the pharmacy on 4/26 ,but they said he signed for it--- Pt has looked thru house & car not able to locate & he is completely out and has been for a while now--- Advised pt to contact his Ins Co tell them what has transpired, he states they said for [place] to just send the prescription to Pharmacy :  hydrochlorothiazide (MICROZIDE) 12.5 MG capsule [id number]   Order Details Dose, Route, Frequency: As Directed  Dispense Quantity: 90 capsule Refills: 0       Sig: TAKE 1 CAPSULE BY MOUTH EVERY DAY   --IF refill order approved send to :    [id number] [place], [address] [place] [address]  Phone:  [phone] [phone]   --Forwarding message & refill request to med asst for review w/provider.  --glh

## 2020-04-18 ENCOUNTER — Other Ambulatory Visit: Payer: Self-pay | Admitting: Physician Assistant

## 2020-04-20 ENCOUNTER — Other Ambulatory Visit: Payer: Self-pay | Admitting: Physician Assistant

## 2020-05-18 ENCOUNTER — Other Ambulatory Visit: Payer: Self-pay | Admitting: Physician Assistant

## 2020-05-20 ENCOUNTER — Encounter: Payer: Self-pay | Admitting: Physician Assistant

## 2020-05-24 ENCOUNTER — Ambulatory Visit: Payer: Medicare Other | Admitting: Physician Assistant

## 2020-06-14 ENCOUNTER — Encounter: Payer: Self-pay | Admitting: Physician Assistant

## 2020-06-14 ENCOUNTER — Other Ambulatory Visit: Payer: Self-pay

## 2020-06-14 ENCOUNTER — Ambulatory Visit (INDEPENDENT_AMBULATORY_CARE_PROVIDER_SITE_OTHER): Payer: Medicare Other | Admitting: Physician Assistant

## 2020-06-14 VITALS — BP 130/82 | Ht 70.0 in | Wt 174.0 lb

## 2020-06-14 DIAGNOSIS — I10 Essential (primary) hypertension: Secondary | ICD-10-CM

## 2020-06-14 DIAGNOSIS — E785 Hyperlipidemia, unspecified: Secondary | ICD-10-CM | POA: Diagnosis not present

## 2020-06-14 DIAGNOSIS — N1832 Chronic kidney disease, stage 3b: Secondary | ICD-10-CM | POA: Diagnosis not present

## 2020-06-14 DIAGNOSIS — E1169 Type 2 diabetes mellitus with other specified complication: Secondary | ICD-10-CM | POA: Diagnosis not present

## 2020-06-14 NOTE — Progress Notes (Signed)
Telehealth office visit note for Jason Reid, PA-C- at Primary Care at [facility]   I connected with current patient today by telephone and verified that I am speaking with the correct person   . Location of the patient: Home . Location of the provider: Office - This visit type was conducted due to national recommendations for restrictions regarding the COVID-19 Pandemic (e.g. social distancing) in an effort to limit this patient's exposure and mitigate transmission in our community.    - No physical exam could be performed with this format, beyond that communicated to [place] by the patient/ family members as noted.   - Additionally my office staff/ schedulers were to discuss with the patient that there may be a monetary charge related to this service, depending on their medical insurance.  My understanding is that patient understood and consented to proceed.     _________________________________________________________________________________   History of Present Illness: Pt calls in to follow up on diabetes mellitus, hypertension, and hyperlipidemia. Pt reports he is doing well and has no acute concerns today.  Diabetes: Pt denies increased urination or thirst. Pt reports medication compliance. Has not been checking glucose lately. States he rarely has sweets and monitors carbohydrates.  HTN: Pt denies chest pain, palpitations, dizziness or leg swelling. Taking medication as directed without side effects. Checks BP at home and readings are usually <130/85.   HLD: Pt taking medication as directed without issues. Denies side effects including myalgias and RUQ pain. States he does walk but could do better.    No flowsheet data found.  Depression screen [facility] 2/9 06/14/2020 02/22/2020 11/10/2019 08/10/2019 05/14/2019  Decreased Interest 0 0 0 0 0  Down, Depressed, Hopeless 0 0 0 0 0  PHQ - 2 Score 0 0 0 0 0  Altered sleeping 0 0 0 0 0  Tired, decreased energy 0 0 0 0 0  Change in  appetite 0 0 0 0 0  Feeling bad or failure about yourself  0 0 0 0 0  Trouble concentrating 0 0 0 0 0  Moving slowly or fidgety/restless 0 0 0 0 0  Suicidal thoughts 0 0 0 0 0  PHQ-9 Score 0 0 0 0 0  Difficult doing work/chores - - - - -  Some recent data might be hidden      Impression and Recommendations:     1. Type 2 diabetes mellitus with other specified complication, without long-term current use of insulin ([place])   2. Hyperlipidemia associated with type 2 diabetes mellitus ([place])   3. Essential hypertension   4. Stage 3b chronic kidney disease     Type 2 diabetes mellitus with other specified complication, without long-term current use of insulin: -Last A1c stable -Continue Metformin -Continue to follow a low carbohydrate and glucose diet. -Recommend to increase physical activity. -Advised to schedule lab visit for fasting blood work including A1c and magnesium.  Hyperlipidemia associated with type 2 diabetes mellitus: -Last lipid panel WNL, LDL 82 -Follow a heart health diet. -Continue current medication regimen. -Plan to recheck lipid panel and hepatic function with lab visit  Essential hypertension: -BP stable -Continue current medication regimen. -Continue ambulatory BP and pulse monitoring. -Will continue to monitor.  CKD, stage III: -Stable -Last CMP: Creatinine 1.08, GFR 69 -Will continue to monitor.    - As part of my medical decision making, I reviewed the following data within the electronic MEDICAL RECORD NUMBER History obtained from pt /family, CMA notes reviewed and  incorporated if applicable, Labs reviewed, Radiograph/ tests reviewed if applicable and OV notes from prior OV's with me, as well as any other specialists she/he has seen since seeing me last, were all reviewed and used in my medical decision making process today.    - Additionally, when appropriate, discussion had with patient regarding our treatment plan, and their biases/concerns about that  plan were used in my medical decision making today.    - The patient agreed with the plan and demonstrated an understanding of the instructions.   No barriers to understanding were identified.     - The patient was advised to call back or seek an in-person evaluation if the symptoms worsen or if the condition fails to improve as anticipated.   Return in about 3 months (around 09/13/2020) for [facility]; lab visit for FBW (include Magnesium) within next few wks.    No orders of the defined types were placed in this encounter.   No orders of the defined types were placed in this encounter.   There are no discontinued medications.     Time spent on visit including pre-visit chart review and post-visit care was 10 minutes.      The [place] was signed into law in 2016 which includes the topic of electronic health records.  This provides immediate access to information in MyChart.  This includes consultation notes, operative notes, office notes, lab results and pathology reports.  If you have any questions about what you read please let us know at your next visit or call us at the office.  We are right here with you.  Note:  This note was prepared with assistance of Dragon voice recognition software. Occasional wrong-word or sound-a-like substitutions may have occurred due to the inherent limitations of voice recognition software.   __________________________________________________________________________________     Patient Care Team    Relationship Specialty Notifications Start End  Jason [name], [place] PCP - General Physician Assistant  02/22/20   Barbaraann Cao, [place] Referring Physician Optometry  06/12/17   Tracie Harrier, MD Consulting Physician   08/25/18      -Vitals obtained; medications/ allergies reconciled;  personal medical, social, Sx etc.histories were updated by CMA, reviewed by me and are reflected in chart   Patient Active Problem List    Diagnosis Date Noted  . CKD (chronic kidney disease), stage III 09/09/2018  . AKI (acute kidney injury) ([place]) 07/21/2018  . Benign essential hypertension 07/21/2018  . Diabetes mellitus without complication ([place]) 97/41/6384  . Hyperlipidemia associated with type 2 diabetes mellitus ([place]) 05/16/2017  . Type 2 diabetes mellitus ([place]) 05/16/2017  . HTN (hypertension) 04/15/2017  . Routine health maintenance 04/15/2017  . Fatigue 04/15/2017     Current Meds  Medication Sig  . atorvastatin (LIPITOR) 80 MG tablet TAKE 1 TABLET BY MOUTH EVERY DAY  . blood glucose meter kit and supplies Please dispense One Touch Ultra Kit for use to check fasting blood sugars once daily  . Blood Glucose Monitoring Suppl (ACCU-CHEK AVIVA PLUS) w/Device KIT Use to check blood sugars fasting every morning and 2 hours after largest meal  . Blood Pressure KIT Use to check blood pressure twice daily  . glucose blood (ACCU-CHEK AVIVA PLUS) test strip Use to check blood sugars fasting every morning and 2 hours after largest meal  . hydrochlorothiazide (MICROZIDE) 12.5 MG capsule TAKE 1 CAPSULE BY MOUTH EVERY DAY  . Lancets (ACCU-CHEK SOFT TOUCH) lancets Use to check blood sugars fasting every  morning and 2 hours after largest meal  . Lancets Misc. (ACCU-CHEK SOFTCLIX LANCET DEV) KIT Use to check blood sugars fasting every morning and 2 hours after largest meal  . lisinopril (ZESTRIL) 30 MG tablet TAKE 1 TABLET BY MOUTH EVERY DAY  . metFORMIN (GLUCOPHAGE) 500 MG tablet TAKE 1/2 TABLET BY MOUTH WITH A MEAL     Allergies:  No Known Allergies   ROS:  See above HPI for pertinent positives and negatives   Objective:   Blood pressure 130/82, height '5\' 10"'  (1.778 m), weight 174 lb (78.9 kg).  (if some vitals are omitted, this means that patient was UNABLE to obtain them even though they were asked to get them prior to OV today.  They were asked to call us at their earliest convenience with these once obtained.  ) General: A & O * 3; sounds in no acute distress; in usual state of health.  Respiratory: speaking in full sentences, no conversational dyspnea Psych: insight appears good, mood- appears full

## 2020-08-16 ENCOUNTER — Other Ambulatory Visit: Payer: Self-pay | Admitting: Physician Assistant

## 2020-08-16 DIAGNOSIS — I10 Essential (primary) hypertension: Secondary | ICD-10-CM

## 2020-09-22 ENCOUNTER — Other Ambulatory Visit: Payer: Self-pay | Admitting: Physician Assistant

## 2020-11-03 ENCOUNTER — Telehealth: Payer: Self-pay | Admitting: Physician Assistant

## 2020-11-03 MED ORDER — ATORVASTATIN CALCIUM 80 MG PO TABS: ORAL_TABLET | ORAL | 0 refills | Status: DC

## 2020-11-03 NOTE — Telephone Encounter (Signed)
Patient called in requesting a refill on atorvastatin. He uses [facility] on [address]. I let him know the medication was flagged and need an appointment. He is on the schedule for march the 7 th at 10:45. He is out, and wondered if he can get enough to hold him over. Thanks   In his appointment for March the 7 th I put a med refill for atorvastatin and listed he has diabetes and hypertension as well. Thanks

## 2020-11-03 NOTE — Addendum Note (Signed)
Addended by: Mickel Crow on: 11/03/2020 02:37 PM   Modules accepted: Orders

## 2020-11-03 NOTE — Telephone Encounter (Signed)
rx refill sent to pharmacy. AS, CMA

## 2020-11-16 ENCOUNTER — Telehealth: Payer: Self-pay | Admitting: Physician Assistant

## 2020-11-16 ENCOUNTER — Other Ambulatory Visit: Payer: Self-pay | Admitting: Physician Assistant

## 2020-11-16 DIAGNOSIS — I10 Essential (primary) hypertension: Secondary | ICD-10-CM

## 2020-11-16 NOTE — Telephone Encounter (Signed)
Patient scheduled per last AVS

## 2020-11-16 NOTE — Telephone Encounter (Signed)
Please contact pt to schedule apt per last AVS for further med refills. AS, CMA 

## 2020-11-17 ENCOUNTER — Other Ambulatory Visit: Payer: Self-pay | Admitting: Physician Assistant

## 2020-12-02 ENCOUNTER — Other Ambulatory Visit: Payer: Self-pay | Admitting: Physician Assistant

## 2020-12-02 DIAGNOSIS — E785 Hyperlipidemia, unspecified: Secondary | ICD-10-CM

## 2020-12-02 DIAGNOSIS — Z0000 Encounter for general adult medical examination without abnormal findings: Secondary | ICD-10-CM

## 2020-12-02 DIAGNOSIS — I10 Essential (primary) hypertension: Secondary | ICD-10-CM

## 2020-12-02 DIAGNOSIS — E1169 Type 2 diabetes mellitus with other specified complication: Secondary | ICD-10-CM

## 2020-12-05 ENCOUNTER — Other Ambulatory Visit: Payer: Medicare Other

## 2020-12-05 ENCOUNTER — Ambulatory Visit: Payer: Medicare Other | Admitting: Physician Assistant

## 2020-12-08 ENCOUNTER — Encounter: Payer: Self-pay | Admitting: Physician Assistant

## 2020-12-08 ENCOUNTER — Ambulatory Visit (INDEPENDENT_AMBULATORY_CARE_PROVIDER_SITE_OTHER): Payer: Medicare Other | Admitting: Physician Assistant

## 2020-12-08 VITALS — Ht 70.0 in | Wt 175.0 lb

## 2020-12-08 DIAGNOSIS — Z1211 Encounter for screening for malignant neoplasm of colon: Secondary | ICD-10-CM | POA: Diagnosis not present

## 2020-12-08 DIAGNOSIS — I10 Essential (primary) hypertension: Secondary | ICD-10-CM | POA: Diagnosis not present

## 2020-12-08 DIAGNOSIS — E1169 Type 2 diabetes mellitus with other specified complication: Secondary | ICD-10-CM

## 2020-12-08 DIAGNOSIS — E785 Hyperlipidemia, unspecified: Secondary | ICD-10-CM | POA: Diagnosis not present

## 2020-12-08 DIAGNOSIS — Z0000 Encounter for general adult medical examination without abnormal findings: Secondary | ICD-10-CM | POA: Diagnosis not present

## 2020-12-08 MED ORDER — METFORMIN HCL 500 MG PO TABS: ORAL_TABLET | ORAL | 0 refills | Status: DC

## 2020-12-08 MED ORDER — HYDROCHLOROTHIAZIDE 12.5 MG PO CAPS: ORAL_CAPSULE | ORAL | 0 refills | Status: DC

## 2020-12-08 NOTE — Progress Notes (Signed)
Virtual Visit via Telephone Note:  I connected with Jason [name] by telephone and verified that I am speaking with the correct person using two identifiers.    I discussed the limitations, risks, security and privacy concerns for performing an evaluation and management service by telephone and the availability of in person appointments. The staff discussed with patient that there may be a patient responsible charge related to this service. The patient expressed understanding and agreed to proceed.   Location of Patient- Home Location of Provider- Office    Subjective:   Jason [name] is a 75 y.o. male who presents for Medicare Annual/Subsequent preventive examination.  Review of Systems    General:   No F/C, wt loss Pulm:   No DIB, SOB, pleuritic chest pain Card:  No CP, palpitations Abd:  No n/v/d or pain Ext:  No inc edema from baseline    Objective:    Today's Vitals   12/08/20 1115  Weight: 175 lb (79.4 kg)  Height: '5\' 10"'  (1.778 m)   Body mass index is 25.11 kg/m.  Advanced Directives 04/15/2017  Does Patient Have a Medical Advance Directive? Yes  Type of Advance Directive Out of facility DNR (pink MOST or yellow form)    Current Medications (verified) Outpatient Encounter Medications as of 12/08/2020  Medication Sig  . atorvastatin (LIPITOR) 80 MG tablet TAKE 1 TABLET BY MOUTH EVERY DAY**NEEDS APT FOR REFILLS**  . blood glucose meter kit and supplies Please dispense One Touch Ultra Kit for use to check fasting blood sugars once daily  . Blood Glucose Monitoring Suppl (ACCU-CHEK AVIVA PLUS) w/Device KIT Use to check blood sugars fasting every morning and 2 hours after largest meal  . Blood Pressure KIT Use to check blood pressure twice daily  . glucose blood (ACCU-CHEK AVIVA PLUS) test strip Use to check blood sugars fasting every morning and 2 hours after largest meal  . Lancets (ACCU-CHEK SOFT TOUCH) lancets Use to check blood sugars fasting every  morning and 2 hours after largest meal  . Lancets Misc. (ACCU-CHEK SOFTCLIX LANCET DEV) KIT Use to check blood sugars fasting every morning and 2 hours after largest meal  . lisinopril (ZESTRIL) 30 MG tablet **NEEDS APT FOR REFILLS** TAKE 1 TABLET BY MOUTH EVERY DAY  . [DISCONTINUED] hydrochlorothiazide (MICROZIDE) 12.5 MG capsule TAKE 1 CAPSULE BY MOUTH EVERY DAY  . [DISCONTINUED] metFORMIN (GLUCOPHAGE) 500 MG tablet TAKE 1/2 TABLET BY MOUTH WITH A MEAL  . hydrochlorothiazide (MICROZIDE) 12.5 MG capsule TAKE 1 CAPSULE BY MOUTH EVERY DAY  . metFORMIN (GLUCOPHAGE) 500 MG tablet TAKE 1/2 TABLET BY MOUTH WITH A MEAL   No facility-administered encounter medications on file as of 12/08/2020.    Allergies (verified) Patient has no known allergies.   History: Past Medical History:  Diagnosis Date  . Cataract   . Diabetes mellitus without complication ([place])   . Hypertension   . Wrist injury    Past Surgical History:  Procedure Laterality Date  . APPENDECTOMY    . CATARACT EXTRACTION     Family History  Problem Relation Age of Onset  . Stroke Mother   . Arthritis Father    Social History   Socioeconomic History  . Marital status: Married    Spouse name: Not on file  . Number of children: Not on file  . Years of education: Not on file  . Highest education level: Not on file  Occupational History  . Not on file  Tobacco Use  . Smoking  status: Never Smoker  . Smokeless tobacco: Never Used  Vaping Use  . Vaping Use: Never used  Substance and Sexual Activity  . Alcohol use: Yes    Alcohol/week: 4.0 standard drinks    Types: 4 Cans of beer per week  . Drug use: No  . Sexual activity: Never    Partners: Female  Other Topics Concern  . Not on file  Social History Narrative  . Not on file   Social Determinants of Health   Financial Resource Strain: Not on file  Food Insecurity: Not on file  Transportation Needs: Not on file  Physical Activity: Not on file  Stress: Not on  file  Social Connections: Not on file    Tobacco Counseling Counseling given: Not Answered    Diabetic?Yes         Activities of Daily Living In your present state of health, do you have any difficulty performing the following activities: 06/14/2020  Hearing? N  Vision? N  Difficulty concentrating or making decisions? N  Walking or climbing stairs? N  Dressing or bathing? N  Doing errands, shopping? N  Some recent data might be hidden    Patient Care Team: Lorrene Reid, PA-C as PCP - General (Physician Assistant) Barbaraann Cao, OD as Referring Physician (Optometry) Nwobu, Lyman Bishop, MD as Consulting Physician  Indicate any recent Medical Services you may have received from other than [facility] providers in the past year (date may be approximate).     Assessment:   This is a routine wellness examination for Jason [name].  Hearing/Vision screen No exam data present  Dietary issues and exercise activities discussed: -Continue to monitor carbohydrates and sweets, follow a heart healthy diet and continue walking regimen. Stay well hydrated.  Goals   None    Depression Screen PHQ 2/9 Scores 12/08/2020 06/14/2020 02/22/2020 11/10/2019 08/10/2019 05/14/2019 09/09/2018  PHQ - 2 Score 0 0 0 0 0 0 0  PHQ- 9 Score 0 0 0 0 0 0 0    Fall Risk Fall Risk  12/08/2020 06/14/2020 09/09/2018 06/24/2018 05/16/2017  Falls in the past year? 0 0 0 No No  Follow up Falls evaluation completed Falls evaluation completed - - -    FALL RISK PREVENTION PERTAINING TO THE HOME:  Any stairs in or around the home? No  If so, are there any without handrails? No  Home free of loose throw rugs in walkways, pet beds, electrical cords, etc? Yes  Adequate lighting in your home to reduce risk of falls? Yes   ASSISTIVE DEVICES UTILIZED TO PREVENT FALLS:  Life alert? No  Use of a cane, walker or w/c? No  Grab bars in the bathroom? Yes  Shower chair or bench in shower? Yes  Elevated toilet seat or  a handicapped toilet? No   TIMED UP AND GO:  Was the test performed? No .  Length of time to ambulate 10 feet: 0 sec.   Telehealth  Cognitive Function: wnl's     6CIT Screen 12/08/2020  What Year? 0 points  What time? 3 points  Count back from 20 2 points  Months in reverse 0 points  Repeat phrase 0 points    Immunizations Immunization History  Administered Date(s) Administered  . Fluad Quad(high Dose 65+) 07/02/2019  . Influenza Split 05/17/2017  . Influenza, High Dose Seasonal PF 06/10/2018  . Influenza,inj,Quad PF,6+ Mos 07/10/2018  . Influenza,inj,Quad PF,6-35 Mos 07/10/2018  . Influenza-Unspecified 05/17/2017  . Pneumococcal Conjugate-13 10/14/2017  . Tdap 10/01/2010  TDAP status: Due, Education has been provided regarding the importance of this vaccine. Advised may receive this vaccine at local pharmacy or Health Dept. Aware to provide a copy of the vaccination record if obtained from local pharmacy or Health Dept. Verbalized acceptance and understanding.  Flu Vaccine status: Up to date  Pneumococcal vaccine status: Due, Education has been provided regarding the importance of this vaccine. Advised may receive this vaccine at local pharmacy or Health Dept. Aware to provide a copy of the vaccination record if obtained from local pharmacy or Health Dept. Verbalized acceptance and understanding.  Covid-19 vaccine status: Completed vaccines  Qualifies for Shingles Vaccine? No   Zostavax completed No   Shingrix Completed?: No.    Education has been provided regarding the importance of this vaccine. Patient has been advised to call insurance company to determine out of pocket expense if they have not yet received this vaccine. Advised may also receive vaccine at local pharmacy or Health Dept. Verbalized acceptance and understanding.  Screening Tests Health Maintenance  Topic Date Due  . Hepatitis C Screening  Never done  . COVID-19 Vaccine (1) Never done  .  COLONOSCOPY (Pts 45-52yr Insurance coverage will need to be confirmed)  01/18/2018  . PNA vac Low Risk Adult (2 of 2 - PPSV23) 10/14/2018  . OPHTHALMOLOGY EXAM  12/04/2018  . INFLUENZA VACCINE  05/01/2020  . FOOT EXAM  08/09/2020  . HEMOGLOBIN A1C  08/24/2020  . TETANUS/TDAP  10/01/2020  . HPV VACCINES  Aged Out    Health Maintenance  Health Maintenance Due  Topic Date Due  . Hepatitis C Screening  Never done  . COVID-19 Vaccine (1) Never done  . COLONOSCOPY (Pts 45-472yrInsurance coverage will need to be confirmed)  01/18/2018  . PNA vac Low Risk Adult (2 of 2 - PPSV23) 10/14/2018  . OPHTHALMOLOGY EXAM  12/04/2018  . INFLUENZA VACCINE  05/01/2020  . FOOT EXAM  08/09/2020  . HEMOGLOBIN A1C  08/24/2020  . TETANUS/TDAP  10/01/2020    Colorectal cancer screening: Referral to GI placed today. Pt aware the office will call re: appt.  Lung Cancer Screening: (Low Dose CT Chest recommended if Age 73-80ears, 30 pack-year currently smoking OR have quit w/in 15years.) does not qualify.   Lung Cancer Screening Referral:   Additional Screening:  Hepatitis C Screening: does qualify; Completed Pt declined  Vision Screening: Recommended annual ophthalmology exams for early detection of glaucoma and other disorders of the eye. Is the patient up to date with their annual eye exam?  Yes  Who is the provider or what is the name of the office in which the patient attends annual [place] exams[place] not established with a provider, would they like to be referred to a provider to establish care? no  Dental Screening: Recommended annual dental exams for proper oral hygiene  Community Resource Referral / Chronic Care Management: CRR required this visit?  No   CCM required this visit?  No      Plan:  -Recommend to schedule annual diabetic eye exam. -Continue current medication regimen. Provided med refills. -Schedule lab visit for FBW and urine microalbumin.  -Follow up  in 4 months for DM, HTN, HLD  I have personally reviewed and noted the following in the patient's chart:   . Medical and social history . Use of alcohol, tobacco or illicit drugs  . Current medications and supplements . Functional ability and status . Nutritional status . Physical activity .  Advanced directives . List of other physicians . Hospitalizations, surgeries, and ER visits in previous 12 months . Vitals . Screenings to include cognitive, depression, and falls . Referrals and appointments  In addition, I have reviewed and discussed with patient certain preventive protocols, quality metrics, and best practice recommendations. A written personalized care plan for preventive services as well as general preventive health recommendations were provided to patient.

## 2020-12-08 NOTE — Patient Instructions (Signed)
Diabetes Mellitus and Nutrition, Adult When you have diabetes, or diabetes mellitus, it is very important to have healthy eating habits because your blood sugar (glucose) levels are greatly affected by what you eat and drink. Eating healthy foods in the right amounts, at about the same times every day, can help you:  Control your blood glucose.  Lower your risk of heart disease.  Improve your blood pressure.  Reach or maintain a healthy weight. What can affect my meal plan? Every person with diabetes is different, and each person has different needs for a meal plan. Your health care provider may recommend that you work with a dietitian to make a meal plan that is best for you. Your meal plan may vary depending on factors such as:  The calories you need.  The medicines you take.  Your weight.  Your blood glucose, blood pressure, and cholesterol levels.  Your activity level.  Other health conditions you have, such as heart or kidney disease. How do carbohydrates affect me? Carbohydrates, also called carbs, affect your blood glucose level more than any other type of food. Eating carbs naturally raises the amount of glucose in your blood. Carb counting is a method for keeping track of how many carbs you eat. Counting carbs is important to keep your blood glucose at a healthy level, especially if you use insulin or take certain oral diabetes medicines. It is important to know how many carbs you can safely have in each meal. This is different for every person. Your dietitian can help you calculate how many carbs you should have at each meal and for each snack. How does alcohol affect me? Alcohol can cause a sudden decrease in blood glucose (hypoglycemia), especially if you use insulin or take certain oral diabetes medicines. Hypoglycemia can be a life-threatening condition. Symptoms of hypoglycemia, such as sleepiness, dizziness, and confusion, are similar to symptoms of having too much  alcohol.  Do not drink alcohol if: ? Your health care provider tells you not to drink. ? You are pregnant, may be pregnant, or are planning to become pregnant.  If you drink alcohol: ? Do not drink on an empty stomach. ? Limit how much you use to:  0-1 drink a day for women.  0-2 drinks a day for men. ? Be aware of how much alcohol is in your drink. In the [place] one drink equals one 12 oz bottle of beer (355 mL), one 5 oz glass of wine (148 mL), or one 1 oz glass of hard liquor (44 mL). ? Keep yourself hydrated with water, diet soda, or unsweetened iced tea.  Keep in mind that regular soda, juice, and other mixers may contain a lot of sugar and must be counted as carbs. What are tips for following this plan? Reading food labels  Start by checking the serving size on the "Nutrition Facts" label of packaged foods and drinks. The amount of calories, carbs, fats, and other nutrients listed on the label is based on one serving of the item. Many items contain more than one serving per package.  Check the total grams (g) of carbs in one serving. You can calculate the number of servings of carbs in one serving by dividing the total carbs by 15. For example, if a food has 30 g of total carbs per serving, it would be equal to 2 servings of carbs.  Check the number of grams (g) of saturated fats and trans fats in one serving. Choose foods that have   a low amount or none of these fats.  Check the number of milligrams (mg) of salt (sodium) in one serving. Most people should limit total sodium intake to less than 2,300 mg per day.  Always check the nutrition information of foods labeled as "low-fat" or "nonfat." These foods may be higher in added sugar or refined carbs and should be avoided.  Talk to your dietitian to identify your daily goals for nutrients listed on the label. Shopping  Avoid buying canned, pre-made, or processed foods. These foods tend to be high in fat, sodium, and added  sugar.  Shop around the outside edge of the grocery store. This is where you will most often find fresh fruits and vegetables, bulk grains, fresh meats, and fresh dairy. Cooking  Use low-heat cooking methods, such as baking, instead of high-heat cooking methods like deep frying.  Cook using healthy oils, such as olive, canola, or sunflower oil.  Avoid cooking with butter, cream, or high-fat meats. Meal planning  Eat meals and snacks regularly, preferably at the same times every day. Avoid going long periods of time without eating.  Eat foods that are high in fiber, such as fresh fruits, vegetables, beans, and whole grains. Talk with your dietitian about how many servings of carbs you can eat at each meal.  Eat 4-6 oz (112-168 g) of lean protein each day, such as lean meat, chicken, fish, eggs, or tofu. One ounce (oz) of lean protein is equal to: ? 1 oz (28 g) of meat, chicken, or fish. ? 1 egg. ?  cup (62 g) of tofu.  Eat some foods each day that contain healthy fats, such as avocado, nuts, seeds, and fish.   What foods should I eat? Fruits Berries. Apples. Oranges. Peaches. Apricots. Plums. Grapes. Mango. Papaya. Pomegranate. Kiwi. Cherries. Vegetables Lettuce. Spinach. Leafy greens, including kale, chard, collard greens, and mustard greens. Beets. Cauliflower. Cabbage. Broccoli. Carrots. Jason [name] beans. Tomatoes. Peppers. Onions. Cucumbers. Brussels sprouts. Grains Whole grains, such as whole-wheat or whole-grain bread, crackers, tortillas, cereal, and pasta. Unsweetened oatmeal. Quinoa. Brown or wild rice. Meats and other proteins Seafood. Poultry without skin. Lean cuts of poultry and beef. Tofu. Nuts. Seeds. Dairy Low-fat or fat-free dairy products such as milk, yogurt, and cheese. The items listed above may not be a complete list of foods and beverages you can eat. Contact a dietitian for more information. What foods should I avoid? Fruits Fruits canned with  syrup. Vegetables Canned vegetables. Frozen vegetables with butter or cream sauce. Grains Refined white flour and flour products such as bread, pasta, snack foods, and cereals. Avoid all processed foods. Meats and other proteins Fatty cuts of meat. Poultry with skin. Breaded or fried meats. Processed meat. Avoid saturated fats. Dairy Full-fat yogurt, cheese, or milk. Beverages Sweetened drinks, such as soda or iced tea. The items listed above may not be a complete list of foods and beverages you should avoid. Contact a dietitian for more information. Questions to ask a health care provider  Do I need to meet with a diabetes educator?  Do I need to meet with a dietitian?  What number can I call if I have questions?  When are the best times to check my blood glucose? Where to find more information:  American Diabetes Association: diabetes.org  Academy of Nutrition and Dietetics: www.eatright.org  National Institute of Diabetes and Digestive and Kidney Diseases: www.niddk.nih.gov  Association of Diabetes Care and Education Specialists: www.diabeteseducator.org Summary  It is important to have healthy eating   habits because your blood sugar (glucose) levels are greatly affected by what you eat and drink.  A healthy meal plan will help you control your blood glucose and maintain a healthy lifestyle.  Your health care provider may recommend that you work with a dietitian to make a meal plan that is best for you.  Keep in mind that carbohydrates (carbs) and alcohol have immediate effects on your blood glucose levels. It is important to count carbs and to use alcohol carefully. This information is not intended to replace advice given to you by your health care provider. Make sure you discuss any questions you have with your health care provider. Document Revised: 08/25/2019 Document Reviewed: 08/25/2019 Elsevier Patient Education  2021 Elsevier Inc.  

## 2020-12-09 ENCOUNTER — Other Ambulatory Visit: Payer: Self-pay | Admitting: Physician Assistant

## 2020-12-09 DIAGNOSIS — E785 Hyperlipidemia, unspecified: Secondary | ICD-10-CM

## 2020-12-09 DIAGNOSIS — E1169 Type 2 diabetes mellitus with other specified complication: Secondary | ICD-10-CM

## 2020-12-09 DIAGNOSIS — Z0000 Encounter for general adult medical examination without abnormal findings: Secondary | ICD-10-CM

## 2020-12-09 DIAGNOSIS — N1832 Chronic kidney disease, stage 3b: Secondary | ICD-10-CM

## 2020-12-09 DIAGNOSIS — I10 Essential (primary) hypertension: Secondary | ICD-10-CM

## 2020-12-12 ENCOUNTER — Other Ambulatory Visit: Payer: Self-pay

## 2020-12-12 ENCOUNTER — Other Ambulatory Visit: Payer: Medicare Other

## 2020-12-12 ENCOUNTER — Other Ambulatory Visit: Payer: Self-pay | Admitting: Physician Assistant

## 2020-12-12 DIAGNOSIS — Z0000 Encounter for general adult medical examination without abnormal findings: Secondary | ICD-10-CM | POA: Diagnosis not present

## 2020-12-12 DIAGNOSIS — I10 Essential (primary) hypertension: Secondary | ICD-10-CM | POA: Diagnosis not present

## 2020-12-12 DIAGNOSIS — E1169 Type 2 diabetes mellitus with other specified complication: Secondary | ICD-10-CM | POA: Diagnosis not present

## 2020-12-12 DIAGNOSIS — E785 Hyperlipidemia, unspecified: Secondary | ICD-10-CM | POA: Diagnosis not present

## 2020-12-13 LAB — MICROALBUMIN / CREATININE URINE RATIO
Creatinine, Urine: 178.7 mg/dL
Microalb/Creat Ratio: 10 mg/g creat (ref 0–29)
Microalbumin, Urine: 18.1 ug/mL

## 2020-12-13 LAB — COMPREHENSIVE METABOLIC PANEL
ALT: 54 IU/L — ABNORMAL HIGH (ref 0–44)
AST: 32 IU/L (ref 0–40)
Albumin/Globulin Ratio: 1.9 (ref 1.2–2.2)
Albumin: 4 g/dL (ref 3.7–4.7)
Alkaline Phosphatase: 72 IU/L (ref 44–121)
BUN/Creatinine Ratio: 11 (ref 10–24)
BUN: 12 mg/dL (ref 8–27)
Bilirubin Total: 0.8 mg/dL (ref 0.0–1.2)
CO2: 25 mmol/L (ref 20–29)
Calcium: 9.8 mg/dL (ref 8.6–10.2)
Chloride: 100 mmol/L (ref 96–106)
Creatinine, Ser: 1.13 mg/dL (ref 0.76–1.27)
Globulin, Total: 2.1 g/dL (ref 1.5–4.5)
Glucose: 140 mg/dL — ABNORMAL HIGH (ref 65–99)
Potassium: 3.7 mmol/L (ref 3.5–5.2)
Sodium: 140 mmol/L (ref 134–144)
Total Protein: 6.1 g/dL (ref 6.0–8.5)
eGFR: 69 mL/min/{1.73_m2} (ref >59)

## 2020-12-13 LAB — LIPID PANEL
Chol/HDL Ratio: 2.3 ratio (ref 0.0–5.0)
Cholesterol, Total: 117 mg/dL (ref 100–199)
HDL: 50 mg/dL (ref >39)
LDL Chol Calc (NIH): 52 mg/dL (ref 0–99)
Triglycerides: 73 mg/dL (ref 0–149)
VLDL Cholesterol Cal: 15 mg/dL (ref 5–40)

## 2020-12-13 LAB — HEMOGLOBIN A1C
Est. average glucose Bld gHb Est-mCnc: 134 mg/dL
Hgb A1c MFr Bld: 6.3 % — ABNORMAL HIGH (ref 4.8–5.6)

## 2020-12-13 LAB — TSH: TSH: 0.668 u[IU]/mL (ref 0.450–4.500)

## 2021-01-09 ENCOUNTER — Other Ambulatory Visit: Payer: Self-pay | Admitting: Physician Assistant

## 2021-01-09 DIAGNOSIS — I10 Essential (primary) hypertension: Secondary | ICD-10-CM

## 2021-01-13 ENCOUNTER — Other Ambulatory Visit: Payer: Self-pay | Admitting: Physician Assistant

## 2021-01-16 ENCOUNTER — Telehealth: Payer: Self-pay | Admitting: Physician Assistant

## 2021-01-16 NOTE — Telephone Encounter (Signed)
Please reach out to patient to schedule per last AVS for further medication refills. AS, CMA

## 2021-01-18 NOTE — Telephone Encounter (Signed)
Patient scheduled.

## 2021-01-19 ENCOUNTER — Ambulatory Visit (INDEPENDENT_AMBULATORY_CARE_PROVIDER_SITE_OTHER): Payer: Medicare Other | Admitting: Physician Assistant

## 2021-01-19 ENCOUNTER — Other Ambulatory Visit: Payer: Self-pay

## 2021-01-19 ENCOUNTER — Encounter: Payer: Self-pay | Admitting: Physician Assistant

## 2021-01-19 VITALS — BP 135/83 | HR 105 | Temp 97.8°F | Ht 70.0 in | Wt 179.1 lb

## 2021-01-19 DIAGNOSIS — E1159 Type 2 diabetes mellitus with other circulatory complications: Secondary | ICD-10-CM

## 2021-01-19 DIAGNOSIS — I152 Hypertension secondary to endocrine disorders: Secondary | ICD-10-CM

## 2021-01-19 DIAGNOSIS — N1832 Chronic kidney disease, stage 3b: Secondary | ICD-10-CM | POA: Diagnosis not present

## 2021-01-19 DIAGNOSIS — E785 Hyperlipidemia, unspecified: Secondary | ICD-10-CM

## 2021-01-19 DIAGNOSIS — E1169 Type 2 diabetes mellitus with other specified complication: Secondary | ICD-10-CM

## 2021-01-19 NOTE — Assessment & Plan Note (Addendum)
-  Fairly controlled. -Continue current medication regimen. -Will continue to monitor.

## 2021-01-19 NOTE — Assessment & Plan Note (Signed)
-  At goal. -Continue current medication regimen. -Continue ambulatory glucose monitoring. -Will continue to monitor.

## 2021-01-19 NOTE — Progress Notes (Signed)
Established Patient Office Visit  Subjective:  Patient ID: Jason [name], male    DOB: [birth year]  Age: 75 y.o. MRN: [id number]  CC:  Chief Complaint  Patient presents with  . Hypertension  . Hyperlipidemia  . Diabetes    HPI ZHI GEIER presents for follow up on hypertension, hyperlipidemia and diabetes mellitus. Patient has no acute concerns today.  Diabetes: Pt denies increased urination or thirst. Pt reports medication compliance. No hypoglycemic events. Checking glucose at home. FBS average 110s. Continues to monitor sugar intake.  HTN: Pt denies chest pain, palpitations, dizziness, headache or lower extremity swelling. Taking medication as directed without side effects. Has not checked BP at [facility] recently. Pt tries to monitor sodium and stay hydrated.  HLD: Pt taking medication as directed without issues. Denies side effects including myalgias and RUQ pain.    Past Medical History:  Diagnosis Date  . Cataract   . Diabetes mellitus without complication ([place])   . Hypertension   . Wrist injury     Past Surgical History:  Procedure Laterality Date  . APPENDECTOMY    . CATARACT EXTRACTION      Family History  Problem Relation Age of Onset  . Stroke Mother   . Arthritis Father     Social History   Socioeconomic History  . Marital status: Married    Spouse name: Not on file  . Number of children: Not on file  . Years of education: Not on file  . Highest education level: Not on file  Occupational History  . Not on file  Tobacco Use  . Smoking status: Never Smoker  . Smokeless tobacco: Never Used  Vaping Use  . Vaping Use: Never used  Substance and Sexual Activity  . Alcohol use: Yes    Alcohol/week: 4.0 standard drinks    Types: 4 Cans of beer per week  . Drug use: No  . Sexual activity: Never    Partners: Female  Other Topics Concern  . Not on file  Social History Narrative  . Not on file   Social Determinants of Health    Financial Resource Strain: Not on file  Food Insecurity: Not on file  Transportation Needs: Not on file  Physical Activity: Not on file  Stress: Not on file  Social Connections: Not on file  Intimate Partner Violence: Not on file    Outpatient Medications Prior to Visit  Medication Sig Dispense Refill  . atorvastatin (LIPITOR) 80 MG tablet TAKE 1 TABLET BY MOUTH EVERY DAY**NEEDS APT FOR REFILLS** 30 tablet 0  . blood glucose meter kit and supplies Please dispense One Touch Ultra Kit for use to check fasting blood sugars once daily 1 each 0  . Blood Glucose Monitoring Suppl (ACCU-CHEK AVIVA PLUS) w/Device KIT Use to check blood sugars fasting every morning and 2 hours after largest meal 1 kit 0  . Blood Pressure KIT Use to check blood pressure twice daily 1 each 0  . glucose blood (ACCU-CHEK AVIVA PLUS) test strip Use to check blood sugars fasting every morning and 2 hours after largest meal 200 each 12  . hydrochlorothiazide (MICROZIDE) 12.5 MG capsule TAKE 1 CAPSULE BY MOUTH EVERY DAY 90 capsule 0  . Lancets (ACCU-CHEK SOFT TOUCH) lancets Use to check blood sugars fasting every morning and 2 hours after largest meal 200 each 12  . Lancets Misc. (ACCU-CHEK SOFTCLIX LANCET DEV) KIT Use to check blood sugars fasting every morning and 2 hours after largest meal 1 kit  0  . lisinopril (ZESTRIL) 30 MG tablet **NEEDS APT FOR REFILLS** TAKE 1 TABLET BY MOUTH EVERY DAY 60 tablet 0  . metFORMIN (GLUCOPHAGE) 500 MG tablet TAKE 1/2 TABLET BY MOUTH WITH A MEAL 45 tablet 0   No facility-administered medications prior to visit.    No Known Allergies  ROS Review of Systems A fourteen system review of systems was performed and found to be positive as per HPI.    Objective:    Physical Exam General:  Well Developed, well nourished, in no acute distress  Neuro:  Alert and oriented,  extra-ocular muscles intact  HEENT:  Normocephalic, atraumatic, neck supple Skin:  no gross rash, warm,  pink. Cardiac:  RRR, S1 S2 Respiratory:  ECTA B/Lw/o wheezing, Not using accessory muscles, speaking in full sentences- unlabored. Vascular:  Ext warm, no cyanosis apprec.; cap RF less 2 sec. Psych:  No HI/SI, judgement and insight good, Euthymic mood. Full Affect.  BP 135/83   Pulse (!) 105   Temp 97.8 F (36.6 C)   Ht '5\' 10"'  (1.778 m)   Wt 179 lb 1.6 oz (81.2 kg)   SpO2 100%   BMI 25.70 kg/m  Wt Readings from Last 3 Encounters:  01/19/21 179 lb 1.6 oz (81.2 kg)  12/08/20 175 lb (79.4 kg)  06/14/20 174 lb (78.9 kg)     Health Maintenance Due  Topic Date Due  . Hepatitis C Screening  Never done  . COVID-19 Vaccine (1) Never done  . COLONOSCOPY (Pts 45-23yr Insurance coverage will need to be confirmed)  01/18/2018  . PNA vac Low Risk Adult (2 of 2 - PPSV23) 10/14/2018  . OPHTHALMOLOGY EXAM  12/04/2018  . FOOT EXAM  08/09/2020  . TETANUS/TDAP  10/01/2020    There are no preventive care reminders to display for this patient.  Lab Results  Component Value Date   TSH 0.668 12/12/2020   Lab Results  Component Value Date   WBC 7.5 05/19/2019   HGB 15.6 05/19/2019   HCT 43.3 05/19/2019   MCV 102 (H) 05/19/2019   PLT 196 05/19/2019   Lab Results  Component Value Date   NA 140 12/12/2020   K 3.7 12/12/2020   CO2 25 12/12/2020   GLUCOSE 140 (H) 12/12/2020   BUN 12 12/12/2020   CREATININE 1.13 12/12/2020   BILITOT 0.8 12/12/2020   ALKPHOS 72 12/12/2020   AST 32 12/12/2020   ALT 54 (H) 12/12/2020   PROT 6.1 12/12/2020   ALBUMIN 4.0 12/12/2020   CALCIUM 9.8 12/12/2020   Lab Results  Component Value Date   CHOL 117 12/12/2020   Lab Results  Component Value Date   HDL 50 12/12/2020   Lab Results  Component Value Date   LDLCALC 52 12/12/2020   Lab Results  Component Value Date   TRIG 73 12/12/2020   Lab Results  Component Value Date   CHOLHDL 2.3 12/12/2020   Lab Results  Component Value Date   HGBA1C 6.3 (H) 12/12/2020      Assessment & Plan:    Problem List Items Addressed This Visit      Cardiovascular and Mediastinum   Hypertension associated with diabetes ([place]    -Fairly controlled. -Continue current medication regimen. -Will continue to monitor.        Endocrine   Hyperlipidemia associated with type 2 diabetes mellitus ([place]    -Last lipid panel: total cholesterol 117, triglycerides 73, HDL 50, LDL 52 -Continue current medication regimen. Last hepatic function stable. -Follow  heart healthy diet. -Will continue to monitor.      Type 2 diabetes mellitus ([place]) - Primary    -At goal. -Continue current medication regimen. -Continue ambulatory glucose monitoring. -Will continue to monitor.        Genitourinary   CKD (chronic kidney disease), stage III ([facility])    -Last CMP: Cr 1.13, eGFR 69 -Will continue to monitor.         No orders of the defined types were placed in this encounter.   Follow-up: Return in about 5 months (around 06/21/2021) for DM, HTN, HLD and FBW few days prior .   Note:  This note was prepared with assistance of Dragon voice recognition software. Occasional wrong-word or sound-a-like substitutions may have occurred due to the inherent limitations of voice recognition software.   Lorrene Reid, PA-C

## 2021-01-19 NOTE — Assessment & Plan Note (Signed)
-  Last CMP: Cr 1.13, eGFR 69 -Will continue to monitor.

## 2021-01-19 NOTE — Assessment & Plan Note (Addendum)
-  Last lipid panel: total cholesterol 117, triglycerides 73, HDL 50, LDL 52 -Continue current medication regimen. Last hepatic function stable. -Follow heart healthy diet. -Will continue to monitor.

## 2021-01-19 NOTE — Patient Instructions (Signed)

## 2021-01-26 ENCOUNTER — Other Ambulatory Visit: Payer: Self-pay | Admitting: Physician Assistant

## 2021-01-26 DIAGNOSIS — I10 Essential (primary) hypertension: Secondary | ICD-10-CM

## 2021-02-08 ENCOUNTER — Other Ambulatory Visit: Payer: Self-pay | Admitting: Physician Assistant

## 2021-03-15 ENCOUNTER — Encounter: Payer: Self-pay | Admitting: Gastroenterology

## 2021-04-26 ENCOUNTER — Other Ambulatory Visit: Payer: Self-pay

## 2021-04-26 DIAGNOSIS — E1169 Type 2 diabetes mellitus with other specified complication: Secondary | ICD-10-CM

## 2021-04-26 MED ORDER — METFORMIN HCL 500 MG PO TABS: ORAL_TABLET | ORAL | 0 refills | Status: DC

## 2021-06-05 ENCOUNTER — Other Ambulatory Visit: Payer: Self-pay | Admitting: Physician Assistant

## 2021-06-05 DIAGNOSIS — I10 Essential (primary) hypertension: Secondary | ICD-10-CM

## 2021-06-08 ENCOUNTER — Telehealth: Payer: Self-pay

## 2021-06-08 NOTE — Telephone Encounter (Signed)
Attempted to reach pt x2 without success for PV.     Left message for pt to call back by 5:00 today to prevent cancellation of procedure.

## 2021-06-08 NOTE — Telephone Encounter (Signed)
Pt called and r/s PV to 9/9

## 2021-06-08 NOTE — Telephone Encounter (Signed)
Inbound call from patient. Rescheduled PV 9/9

## 2021-06-08 NOTE — Telephone Encounter (Deleted)
Inbound call from patient. Rescheduled PV 9/14

## 2021-06-09 ENCOUNTER — Other Ambulatory Visit: Payer: Self-pay

## 2021-06-09 ENCOUNTER — Ambulatory Visit (AMBULATORY_SURGERY_CENTER): Payer: Medicare Other | Admitting: *Deleted

## 2021-06-09 ENCOUNTER — Other Ambulatory Visit: Payer: Self-pay | Admitting: Gastroenterology

## 2021-06-09 ENCOUNTER — Encounter: Payer: Self-pay | Admitting: Gastroenterology

## 2021-06-09 VITALS — Ht 70.0 in | Wt 179.0 lb

## 2021-06-09 DIAGNOSIS — Z86010 Personal history of colonic polyps: Secondary | ICD-10-CM

## 2021-06-09 MED ORDER — PEG-KCL-NACL-NASULF-NA ASC-C 100 G PO SOLR: 1.0000 | Freq: Once | ORAL | 0 refills | Status: AC

## 2021-06-09 NOTE — Progress Notes (Signed)
Patient's pre-visit was done today over the phone with the patient due to COVID-19 pandemic. Name,DOB and address verified. Insurance verified. Patient denies any allergies to Eggs and Soy. Patient denies any problems with anesthesia/sedation. Patient is not taking any diet pills or blood thinners. No home Oxygen. Packet of Prep instructions mailed to patient including a copy of a consent form-pt is aware. Patient understands to call us back with any questions or concerns. Patient is aware of our care-partner policy and 0000000 safety protocol.  The patient is COVID-19 vaccinated.

## 2021-06-19 ENCOUNTER — Other Ambulatory Visit: Payer: Self-pay

## 2021-06-19 ENCOUNTER — Other Ambulatory Visit: Payer: Medicare Other

## 2021-06-19 DIAGNOSIS — E1159 Type 2 diabetes mellitus with other circulatory complications: Secondary | ICD-10-CM | POA: Diagnosis not present

## 2021-06-19 DIAGNOSIS — Z0000 Encounter for general adult medical examination without abnormal findings: Secondary | ICD-10-CM | POA: Diagnosis not present

## 2021-06-19 DIAGNOSIS — E1169 Type 2 diabetes mellitus with other specified complication: Secondary | ICD-10-CM | POA: Diagnosis not present

## 2021-06-19 DIAGNOSIS — E785 Hyperlipidemia, unspecified: Secondary | ICD-10-CM

## 2021-06-19 DIAGNOSIS — I152 Hypertension secondary to endocrine disorders: Secondary | ICD-10-CM

## 2021-06-20 LAB — CBC WITH DIFFERENTIAL/PLATELET
Basophils Absolute: 0 10*3/uL (ref 0.0–0.2)
Basos: 0 %
EOS (ABSOLUTE): 0.1 10*3/uL (ref 0.0–0.4)
Eos: 1 %
Hematocrit: 53.4 % — ABNORMAL HIGH (ref 37.5–51.0)
Hemoglobin: 18.4 g/dL — ABNORMAL HIGH (ref 13.0–17.7)
Immature Grans (Abs): 0 10*3/uL (ref 0.0–0.1)
Immature Granulocytes: 0 %
Lymphocytes Absolute: 1.9 10*3/uL (ref 0.7–3.1)
Lymphs: 23 %
MCH: 36.3 pg — ABNORMAL HIGH (ref 26.6–33.0)
MCHC: 34.5 g/dL (ref 31.5–35.7)
MCV: 105 fL — ABNORMAL HIGH (ref 79–97)
Monocytes Absolute: 0.7 10*3/uL (ref 0.1–0.9)
Monocytes: 9 %
Neutrophils Absolute: 5.4 10*3/uL (ref 1.4–7.0)
Neutrophils: 67 %
Platelets: 209 10*3/uL (ref 150–450)
RBC: 5.07 x10E6/uL (ref 4.14–5.80)
RDW: 13.2 % (ref 11.6–15.4)
WBC: 8.2 10*3/uL (ref 3.4–10.8)

## 2021-06-20 LAB — LIPID PANEL
Chol/HDL Ratio: 4.4 ratio (ref 0.0–5.0)
Cholesterol, Total: 129 mg/dL (ref 100–199)
HDL: 29 mg/dL — ABNORMAL LOW (ref >39)
LDL Chol Calc (NIH): 80 mg/dL (ref 0–99)
Triglycerides: 109 mg/dL (ref 0–149)
VLDL Cholesterol Cal: 20 mg/dL (ref 5–40)

## 2021-06-20 LAB — COMPREHENSIVE METABOLIC PANEL
ALT: 15 IU/L (ref 0–44)
AST: 24 IU/L (ref 0–40)
Albumin/Globulin Ratio: 1.5 (ref 1.2–2.2)
Albumin: 3.8 g/dL (ref 3.7–4.7)
Alkaline Phosphatase: 108 IU/L (ref 44–121)
BUN/Creatinine Ratio: 12 (ref 10–24)
BUN: 11 mg/dL (ref 8–27)
Bilirubin Total: 1.6 mg/dL — ABNORMAL HIGH (ref 0.0–1.2)
CO2: 22 mmol/L (ref 20–29)
Calcium: 9.5 mg/dL (ref 8.6–10.2)
Chloride: 100 mmol/L (ref 96–106)
Creatinine, Ser: 0.89 mg/dL (ref 0.76–1.27)
Globulin, Total: 2.6 g/dL (ref 1.5–4.5)
Glucose: 148 mg/dL — ABNORMAL HIGH (ref 65–99)
Potassium: 4.4 mmol/L (ref 3.5–5.2)
Sodium: 138 mmol/L (ref 134–144)
Total Protein: 6.4 g/dL (ref 6.0–8.5)
eGFR: 91 mL/min/{1.73_m2} (ref >59)

## 2021-06-20 LAB — HEMOGLOBIN A1C
Est. average glucose Bld gHb Est-mCnc: 140 mg/dL
Hgb A1c MFr Bld: 6.5 % — ABNORMAL HIGH (ref 4.8–5.6)

## 2021-06-20 LAB — TSH: TSH: 1.21 u[IU]/mL (ref 0.450–4.500)

## 2021-06-21 ENCOUNTER — Encounter: Payer: Self-pay | Admitting: Gastroenterology

## 2021-06-21 ENCOUNTER — Ambulatory Visit: Payer: Medicare Other | Admitting: Physician Assistant

## 2021-06-21 ENCOUNTER — Ambulatory Visit (AMBULATORY_SURGERY_CENTER): Payer: Medicare Other | Admitting: Gastroenterology

## 2021-06-21 ENCOUNTER — Other Ambulatory Visit: Payer: Self-pay

## 2021-06-21 VITALS — BP 168/79 | HR 60 | Temp 97.3°F | Resp 18 | Ht 70.0 in | Wt 179.0 lb

## 2021-06-21 DIAGNOSIS — Z86010 Personal history of colonic polyps: Secondary | ICD-10-CM

## 2021-06-21 DIAGNOSIS — D122 Benign neoplasm of ascending colon: Secondary | ICD-10-CM

## 2021-06-21 DIAGNOSIS — D128 Benign neoplasm of rectum: Secondary | ICD-10-CM

## 2021-06-21 DIAGNOSIS — D123 Benign neoplasm of transverse colon: Secondary | ICD-10-CM

## 2021-06-21 DIAGNOSIS — D120 Benign neoplasm of cecum: Secondary | ICD-10-CM | POA: Diagnosis not present

## 2021-06-21 DIAGNOSIS — D124 Benign neoplasm of descending colon: Secondary | ICD-10-CM | POA: Diagnosis not present

## 2021-06-21 MED ORDER — SODIUM CHLORIDE 0.9 % IV SOLN
500.0000 mL | Freq: Once | INTRAVENOUS | Status: DC
Start: 2021-06-21 — End: 2021-06-21

## 2021-06-21 NOTE — Progress Notes (Signed)
Sedate, gd SR, tolerated procedure well, VSS, report to RN 

## 2021-06-21 NOTE — Progress Notes (Signed)
History & Physical  Primary Care Physician:  Lorrene Reid, PA-C Primary Gastroenterologist: Jerilynn Mages. Fuller Plan, MD  CHIEF COMPLAINT:  Personal history of colon polyps   HPI: Jason [name] is a 75 y.o. male who presents for surveillance colonoscopy for history of adenomatous colon polyps.  He was previously followed by Dr. Cristina Gong.  Prior colonoscopies in 2005 and 2009 however the procedure reports are not available.  Pathology report from 2005 showed a 1 cm tubulovillous adenoma with focal high-grade dysplasia, and 2 smaller tubular adenomas.  Pathology report from 2009 showed 1 small tubular adenoma.  No active colorectal complaints.   Past Medical History:  Diagnosis Date   Cataract    Chronic kidney disease    Diabetes mellitus without complication ([place])    Hyperlipidemia    Hypertension    Wrist injury     Past Surgical History:  Procedure Laterality Date   APPENDECTOMY     CATARACT EXTRACTION     COLONOSCOPY  2009   Dr.Buccini---adenomas polyps    Prior to Admission medications   Medication Sig Start Date End Date Taking? Authorizing Provider  atorvastatin (LIPITOR) 80 MG tablet TAKE 1 TABLET BY MOUTH EVERY DAY 02/08/21  Yes Abonza, Maritza, PA-C  blood glucose meter kit and supplies Please dispense One Touch Ultra Kit for use to check fasting blood sugars once daily 05/16/17  Yes Danford, Valetta Fuller D, NP  Blood Glucose Monitoring Suppl (ACCU-CHEK AVIVA PLUS) w/Device KIT Use to check blood sugars fasting every morning and 2 hours after largest meal 07/25/18  Yes Danford, Katy D, NP  Blood Pressure KIT Use to check blood pressure twice daily 07/25/18  Yes Danford, Valetta Fuller D, NP  glucose blood (ACCU-CHEK AVIVA PLUS) test strip Use to check blood sugars fasting every morning and 2 hours after largest meal 07/25/18  Yes Danford, Valetta Fuller D, NP  hydrochlorothiazide (MICROZIDE) 12.5 MG capsule TAKE 1 CAPSULE BY MOUTH EVERY DAY 06/06/21  Yes Abonza, Maritza, PA-C  Lancets (ACCU-CHEK SOFT  TOUCH) lancets Use to check blood sugars fasting every morning and 2 hours after largest meal 07/25/18  Yes Danford, Valetta Fuller D, NP  Lancets Misc. (ACCU-CHEK SOFTCLIX LANCET DEV) KIT Use to check blood sugars fasting every morning and 2 hours after largest meal 07/25/18  Yes Danford, Katy D, NP  lisinopril (ZESTRIL) 30 MG tablet TAKE 1 TABLET BY MOUTH EVERY DAY 01/26/21  Yes Abonza, Maritza, PA-C  metFORMIN (GLUCOPHAGE) 500 MG tablet TAKE 1/2 TABLET BY MOUTH WITH A MEAL 04/26/21  Yes Lorrene Reid, PA-C    Current Outpatient Medications  Medication Sig Dispense Refill   atorvastatin (LIPITOR) 80 MG tablet TAKE 1 TABLET BY MOUTH EVERY DAY 90 tablet 0   blood glucose meter kit and supplies Please dispense One Touch Ultra Kit for use to check fasting blood sugars once daily 1 each 0   Blood Glucose Monitoring Suppl (ACCU-CHEK AVIVA PLUS) w/Device KIT Use to check blood sugars fasting every morning and 2 hours after largest meal 1 kit 0   Blood Pressure KIT Use to check blood pressure twice daily 1 each 0   glucose blood (ACCU-CHEK AVIVA PLUS) test strip Use to check blood sugars fasting every morning and 2 hours after largest meal 200 each 12   hydrochlorothiazide (MICROZIDE) 12.5 MG capsule TAKE 1 CAPSULE BY MOUTH EVERY DAY 90 capsule 0   Lancets (ACCU-CHEK SOFT TOUCH) lancets Use to check blood sugars fasting every morning and 2 hours after largest meal 200 each 12  Lancets Misc. (ACCU-CHEK SOFTCLIX LANCET DEV) KIT Use to check blood sugars fasting every morning and 2 hours after largest meal 1 kit 0   lisinopril (ZESTRIL) 30 MG tablet TAKE 1 TABLET BY MOUTH EVERY DAY 90 tablet 1   metFORMIN (GLUCOPHAGE) 500 MG tablet TAKE 1/2 TABLET BY MOUTH WITH A MEAL 45 tablet 0   Current Facility-Administered Medications  Medication Dose Route Frequency Provider Last Rate Last Admin   0.9 %  sodium chloride infusion  500 mL Intravenous Once Ladene Artist, MD        Allergies as of 06/21/2021   (No Known  Allergies)    Family History  Problem Relation Age of Onset   Stroke Mother    Arthritis Father    Colon cancer Neg Hx    Esophageal cancer Neg Hx    Rectal cancer Neg Hx    Stomach cancer Neg Hx     Social History   Socioeconomic History   Marital status: Married    Spouse name: Not on file   Number of children: Not on file   Years of education: Not on file   Highest education level: Not on file  Occupational History   Not on file  Tobacco Use   Smoking status: Never   Smokeless tobacco: Never  Vaping Use   Vaping Use: Never used  Substance and Sexual Activity   Alcohol use: Not Currently    Alcohol/week: 7.0 standard drinks    Types: 7 Glasses of wine per week   Drug use: No   Sexual activity: Never    Partners: Female  Other Topics Concern   Not on file  Social History Narrative   Not on file   Social Determinants of Health   Financial Resource Strain: Not on file  Food Insecurity: Not on file  Transportation Needs: Not on file  Physical Activity: Not on file  Stress: Not on file  Social Connections: Not on file  Intimate Partner Violence: Not on file    Review of Systems:  All systems reviewed an negative except where noted in HPI.  Gen: Denies any fever, chills, sweats, anorexia, fatigue, weakness, malaise, weight loss, and sleep disorder CV: Denies chest pain, angina, palpitations, syncope, orthopnea, PND, peripheral edema, and claudication. Resp: Denies dyspnea at rest, dyspnea with exercise, cough, sputum, wheezing, coughing up blood, and pleurisy. GI: Denies vomiting blood, jaundice, and fecal incontinence.   Denies dysphagia or odynophagia. GU : Denies urinary burning, blood in urine, urinary frequency, urinary hesitancy, nocturnal urination, and urinary incontinence. MS: Denies joint pain, limitation of movement, and swelling, stiffness, low back pain, extremity pain. Denies muscle weakness, cramps, atrophy.  Derm: Denies rash, itching, dry  skin, hives, moles, warts, or unhealing ulcers.  Psych: Denies depression, anxiety, memory loss, suicidal ideation, hallucinations, paranoia, and confusion. Heme: Denies bruising, bleeding, and enlarged lymph nodes. Neuro:  Denies any headaches, dizziness, paresthesias. Endo:  Denies any problems with DM, thyroid, adrenal function.   Physical Exam: General:  Alert, well-developed, in NAD Head:  Normocephalic and atraumatic. Eyes:  Sclera clear, no icterus.   Conjunctiva pink. Ears:  Normal auditory acuity. Mouth:  No deformity or lesions.  Neck:  Supple; no masses . Lungs:  Clear throughout to auscultation.   No wheezes, crackles, or rhonchi. No acute distress. Heart:  Regular rate and rhythm; no murmurs. Abdomen:  Soft, nondistended, nontender. No masses, hepatomegaly. No obvious masses.  Normal bowel .    Rectal:  Deferred   Msk:  Symmetrical without gross deformities.. Pulses:  Normal pulses noted. Extremities:  Without edema. Neurologic:  Alert and  oriented x4;  grossly normal neurologically. Skin:  Intact without significant lesions or rashes. Cervical Nodes:  No significant cervical adenopathy. Psych:  Alert and cooperative. Normal mood and affect.   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  No colonoscopies since 2009.  For surveillance colonoscopy.   This patient is appropriate for endoscopic procedures in the ambulatory setting.    Pricilla Riffle. Fuller Plan  06/21/2021, 3:45 PM

## 2021-06-21 NOTE — Op Note (Signed)
[place] Patient Name: Jason [name] Procedure Date: 06/21/2021 3:38 PM MRN: [id number] Endoscopist: Ladene Artist , MD Age: 75 Referring MD:  Date of Birth: [birth year] Gender: Male Account #: [id number] Procedure:                Colonoscopy Indications:              Surveillance: Personal history of adenomatous                            polyps on last colonoscopy > 5 years ago Medicines:                Monitored Anesthesia Care Procedure:                Pre-Anesthesia Assessment:                           - Prior to the procedure, a History and Physical                            was performed, and patient medications and                            allergies were reviewed. The patient's tolerance of                            previous anesthesia was also reviewed. The risks                            and benefits of the procedure and the sedation                            options and risks were discussed with the patient.                            All questions were answered, and informed consent                            was obtained. Prior Anticoagulants: The patient has                            taken no previous anticoagulant or antiplatelet                            agents. ASA Grade Assessment: II - A patient with                            mild systemic disease. After reviewing the risks                            and benefits, the patient was deemed in                            satisfactory condition to undergo the procedure.  After obtaining informed consent, the colonoscope                            was passed under direct vision. Throughout the                            procedure, the patient's blood pressure, pulse, and                            oxygen saturations were monitored continuously. The                            CF [id number] #[id number] was introduced through the anus                            and advanced to  the the cecum, identified by                            appendiceal orifice and ileocecal valve. The                            ileocecal valve, appendiceal orifice, and rectum                            were photographed. The quality of the bowel                            preparation was adequate after extensive lavage and                            suction. The colonoscopy was somewhat difficult due                            to significant looping and a tortuous colon. The                            patient tolerated the procedure well. Scope In: 3:51:49 PM Scope Out: 4:18:29 PM Scope Withdrawal Time: 0 hours 17 minutes 44 seconds  Total Procedure Duration: 0 hours 26 minutes 40 seconds  Findings:                 The perianal and digital rectal examinations were                            normal.                           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                           A 16 mm polyp was found in the transverse colon.  The polyp was sessile. The polyp was removed with a                            hot snare. Resection and retrieval were complete.                           Eight sessile polyps were found in the rectum (2),                            descending colon (1), transverse colon (3),                            ascending colon (1) and cecum (1). The polyps were                            6 to 12 mm in size. These polyps were removed with                            a cold snare. Resection and retrieval were complete.                           The exam was otherwise without abnormality on                            direct and retroflexion views. Complications:            No immediate complications. Estimated blood loss:                            None. Estimated Blood Loss:     Estimated blood loss: none. 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                            - One 16 mm polyp in the transverse colon, removed                            with a hot snare. Resected and retrieved.                           - Eight 6 to 12 mm polyps in the rectum, in the                            descending colon, in the transverse colon, in the                            ascending colon and in the cecum, removed with a                            cold snare. Resected and retrieved.                           -  The examination was otherwise normal on direct                            and retroflexion views. Recommendation:           - Repeat colonoscopy after studies are complete for                            surveillance based on pathology results with a more                            extensive bowel prep.                           - Patient has a contact number available for                            emergencies. The signs and symptoms of potential                            delayed complications were discussed with the                            patient. Return to normal activities tomorrow.                            Written discharge instructions were provided to the                            patient.                           - Resume previous diet.                           - Continue present medications.                           - Await pathology results.                           - No aspirin, ibuprofen, naproxen, or other                            non-steroidal anti-inflammatory drugs for 2 weeks                            after polyp removal. Ladene Artist, MD 06/21/2021 4:26:10 PM This report has been signed electronically.

## 2021-06-21 NOTE — Progress Notes (Signed)
VS completed by DT.  Pt's states no medical or surgical changes since previsit or office visit.  

## 2021-06-21 NOTE — Progress Notes (Signed)
Called to room to assist during endoscopic procedure.  Patient ID and intended procedure confirmed with present staff. Received instructions for my participation in the procedure from the performing physician.  

## 2021-06-21 NOTE — Patient Instructions (Signed)
Please read handouts provided. Continue present medications. Await pathology results. No aspirin, ibuprofen, naproxen, or other non-steriodal anti-inflammatory drugs for 2 weeks.    YOU HAD AN ENDOSCOPIC PROCEDURE TODAY AT [facility]:   Refer to the procedure report that was given to you for any specific questions about what was found during the examination.  If the procedure report does not answer your questions, please call your gastroenterologist to clarify.  If you requested that your care partner not be given the details of your procedure findings, then the procedure report has been included in a sealed envelope for you to review at your convenience later.  YOU SHOULD EXPECT: Some feelings of bloating in the abdomen. Passage of more gas than usual.  Walking can help get rid of the air that was put into your GI tract during the procedure and reduce the bloating. If you had a lower endoscopy (such as a colonoscopy or flexible sigmoidoscopy) you may notice spotting of blood in your stool or on the toilet paper. If you underwent a bowel prep for your procedure, you may not have a normal bowel movement for a few days.  Please Note:  You might notice some irritation and congestion in your nose or some drainage.  This is from the oxygen used during your procedure.  There is no need for concern and it should clear up in a day or so.  SYMPTOMS TO REPORT IMMEDIATELY:  Following lower endoscopy (colonoscopy or flexible sigmoidoscopy):  Excessive amounts of blood in the stool  Significant tenderness or worsening of abdominal pains  Swelling of the abdomen that is new, acute  Fever of 100F or higher   For urgent or emergent issues, a gastroenterologist can be reached at any hour by calling [phone]. Do not use MyChart messaging for urgent concerns.    DIET:  We do recommend a small meal at first, but then you may proceed to your regular diet.  Drink plenty of fluids but you  should avoid alcoholic beverages for 24 hours.  ACTIVITY:  You should plan to take it easy for the rest of today and you should NOT DRIVE or use heavy machinery until tomorrow (because of the sedation medicines used during the test).    FOLLOW UP: Our staff will call the number listed on your records 48-72 hours following your procedure to check on you and address any questions or concerns that you may have regarding the information given to you following your procedure. If we do not reach you, we will leave a message.  We will attempt to reach you two times.  During this call, we will ask if you have developed any symptoms of COVID 19. If you develop any symptoms (ie: fever, flu-like symptoms, shortness of breath, cough etc.) before then, please call [phone].  If you test positive for Covid 19 in the 2 weeks post procedure, please call and report this information to [place].    If any biopsies were taken you will be contacted by phone or by letter within the next 1-3 weeks.  Please call us at [phone] if you have not heard about the biopsies in 3 weeks.    SIGNATURES/CONFIDENTIALITY: You and/or your care partner have signed paperwork which will be entered into your electronic medical record.  These signatures attest to the fact that that the information above on your After Visit Summary has been reviewed and is understood.  Full responsibility of the confidentiality of this discharge information  lies with you and/or your care-partner.  

## 2021-06-22 ENCOUNTER — Encounter: Payer: Medicare Other | Admitting: Gastroenterology

## 2021-06-23 ENCOUNTER — Telehealth: Payer: Self-pay | Admitting: *Deleted

## 2021-06-23 ENCOUNTER — Telehealth: Payer: Self-pay

## 2021-06-23 NOTE — Telephone Encounter (Signed)
Follow up call made. 

## 2021-06-23 NOTE — Telephone Encounter (Signed)
  Follow up Call-  Call back number 06/21/2021  Post procedure Call Back phone  # [phone]  Permission to leave phone message Yes  Some recent data might be hidden     Patient questions:  Do you have a fever, pain , or abdominal swelling? No. Pain Score  0 *  Have you tolerated food without any problems? Yes.    Have you been able to return to your normal activities? Yes.    Do you have any questions about your discharge instructions: Diet   No. Medications  No. Follow up visit  No.  Do you have questions or concerns about your Care? No.  Actions: * If pain score is 4 or above: No action needed, pain <4.

## 2021-06-29 ENCOUNTER — Other Ambulatory Visit: Payer: Self-pay

## 2021-06-29 DIAGNOSIS — I152 Hypertension secondary to endocrine disorders: Secondary | ICD-10-CM

## 2021-06-29 MED ORDER — ATORVASTATIN CALCIUM 80 MG PO TABS: ORAL_TABLET | ORAL | 0 refills | Status: DC

## 2021-07-05 ENCOUNTER — Encounter: Payer: Self-pay | Admitting: Gastroenterology

## 2021-07-12 ENCOUNTER — Other Ambulatory Visit: Payer: Self-pay

## 2021-07-12 ENCOUNTER — Encounter: Payer: Self-pay | Admitting: Physician Assistant

## 2021-07-12 ENCOUNTER — Ambulatory Visit (INDEPENDENT_AMBULATORY_CARE_PROVIDER_SITE_OTHER): Payer: Medicare Other | Admitting: Physician Assistant

## 2021-07-12 VITALS — BP 140/82 | HR 90 | Temp 98.4°F | Ht 70.0 in | Wt 161.5 lb

## 2021-07-12 DIAGNOSIS — E1159 Type 2 diabetes mellitus with other circulatory complications: Secondary | ICD-10-CM | POA: Diagnosis not present

## 2021-07-12 DIAGNOSIS — E119 Type 2 diabetes mellitus without complications: Secondary | ICD-10-CM

## 2021-07-12 DIAGNOSIS — R7989 Other specified abnormal findings of blood chemistry: Secondary | ICD-10-CM | POA: Diagnosis not present

## 2021-07-12 DIAGNOSIS — E1169 Type 2 diabetes mellitus with other specified complication: Secondary | ICD-10-CM | POA: Diagnosis not present

## 2021-07-12 DIAGNOSIS — I152 Hypertension secondary to endocrine disorders: Secondary | ICD-10-CM | POA: Diagnosis not present

## 2021-07-12 DIAGNOSIS — E785 Hyperlipidemia, unspecified: Secondary | ICD-10-CM | POA: Diagnosis not present

## 2021-07-12 NOTE — Assessment & Plan Note (Signed)
>>  ASSESSMENT AND PLAN FOR DIABETES MELLITUS ([place]) WRITTEN ON 07/12/2021 12:44 PM BY ABONZA, MARITZA, PA-C  -Recent A1c 6.5, mildly increased from 6.3 but stable. -Continue current medication regimen. -Discussed low carbohydrate and glucose diet. Recommend to increase physical activity.  -Discussed scheduling eye exam. -Will continue to monitor.

## 2021-07-12 NOTE — Progress Notes (Signed)
Established Patient Office Visit  Subjective:  Patient ID: Jason [name], male    DOB: [birth year]  Age: 75 y.o. MRN: [id number]  CC:  Chief Complaint  Patient presents with   Follow-up   Diabetes   Hyperlipidemia   Hypertension    HPI Jason [name] presents for follow up on diabetes mellitus, hypertension and hyperlipidemia.  Diabetes mellitus: Pt denies increased urination or thirst. Pt reports medication compliance. No hypoglycemic events. Not checking glucose at home. Walks 2-3 times per week for 15-20 minutes. Plans to schedule eye exam soon.  HTN: Pt denies chest pain, palpitations, dizziness or lower extremity swelling. Taking medication as directed without side effects. Has not been checking BP at home. Pt follows a low salt diet. Drinks about 6 cups of water per day.  HLD: Pt taking medication as directed without issues. Reports has a balanced diet with lean meats (fish and chicken).   Past Medical History:  Diagnosis Date   Cataract    Chronic kidney disease    Diabetes mellitus without complication (HCC)    Hyperlipidemia    Hypertension    Wrist injury     Past Surgical History:  Procedure Laterality Date   APPENDECTOMY     CATARACT EXTRACTION     COLONOSCOPY  2009   Dr.Buccini---adenomas polyps    Family History  Problem Relation Age of Onset   Stroke Mother    Arthritis Father    Colon cancer Neg Hx    Esophageal cancer Neg Hx    Rectal cancer Neg Hx    Stomach cancer Neg Hx     Social History   Socioeconomic History   Marital status: Married    Spouse name: Not on file   Number of children: Not on file   Years of education: Not on file   Highest education level: Not on file  Occupational History   Not on file  Tobacco Use   Smoking status: Never   Smokeless tobacco: Never  Vaping Use   Vaping Use: Never used  Substance and Sexual Activity   Alcohol use: Not Currently    Alcohol/week: 7.0 standard drinks    Types: 7  Glasses of wine per week   Drug use: No   Sexual activity: Never    Partners: Female  Other Topics Concern   Not on file  Social History Narrative   Not on file   Social Determinants of Health   Financial Resource Strain: Not on file  Food Insecurity: Not on file  Transportation Needs: Not on file  Physical Activity: Not on file  Stress: Not on file  Social Connections: Not on file  Intimate Partner Violence: Not on file    Outpatient Medications Prior to Visit  Medication Sig Dispense Refill   atorvastatin (LIPITOR) 80 MG tablet TAKE 1 TABLET BY MOUTH EVERY DAY 90 tablet 0   blood glucose meter kit and supplies Please dispense One Touch Ultra Kit for use to check fasting blood sugars once daily 1 each 0   Blood Glucose Monitoring Suppl (ACCU-CHEK AVIVA PLUS) w/Device KIT Use to check blood sugars fasting every morning and 2 hours after largest meal 1 kit 0   Blood Pressure KIT Use to check blood pressure twice daily 1 each 0   glucose blood (ACCU-CHEK AVIVA PLUS) test strip Use to check blood sugars fasting every morning and 2 hours after largest meal 200 each 12   hydrochlorothiazide (MICROZIDE) 12.5 MG capsule TAKE 1 CAPSULE BY  MOUTH EVERY DAY 90 capsule 0   Lancets (ACCU-CHEK SOFT TOUCH) lancets Use to check blood sugars fasting every morning and 2 hours after largest meal 200 each 12   Lancets Misc. (ACCU-CHEK SOFTCLIX LANCET DEV) KIT Use to check blood sugars fasting every morning and 2 hours after largest meal 1 kit 0   lisinopril (ZESTRIL) 30 MG tablet TAKE 1 TABLET BY MOUTH EVERY DAY 90 tablet 1   metFORMIN (GLUCOPHAGE) 500 MG tablet TAKE 1/2 TABLET BY MOUTH WITH A MEAL 45 tablet 0   No facility-administered medications prior to visit.    No Known Allergies  ROS Review of Systems A fourteen system review of systems was performed and found to be positive as per HPI.   Objective:    Physical Exam General:  Well Developed, well nourished, appropriate for stated age.   Neuro:  Alert and oriented,  extra-ocular muscles intact  HEENT:  Normocephalic, atraumatic, neck supple, no carotid bruits appreciated  Skin:  no gross rash, warm, pink. Cardiac:  RRR, S1 S2 Respiratory: CTA B/L, Not using accessory muscles, speaking in full sentences- unlabored. Vascular:  Ext warm, no cyanosis apprec.; cap RF less 2 sec. Psych:  No HI/SI, judgement and insight good, Euthymic mood. Full Affect.  BP 140/82   Pulse 90   Temp 98.4 F (36.9 C)   Ht '5\' 10"'  (1.778 m)   Wt 161 lb 8 oz (73.3 kg)   SpO2 98%   BMI 23.17 kg/m  Wt Readings from Last 3 Encounters:  07/12/21 161 lb 8 oz (73.3 kg)  06/21/21 179 lb (81.2 kg)  06/09/21 179 lb (81.2 kg)     Health Maintenance Due  Topic Date Due   COVID-19 Vaccine (1) Never done   Hepatitis C Screening  Never done   Zoster Vaccines- Shingrix (1 of 2) Never done   OPHTHALMOLOGY EXAM  12/04/2018   TETANUS/TDAP  10/01/2020   INFLUENZA VACCINE  05/01/2021    There are no preventive care reminders to display for this patient.  Lab Results  Component Value Date   TSH 1.210 06/19/2021   Lab Results  Component Value Date   WBC 8.2 06/19/2021   HGB 18.4 (H) 06/19/2021   HCT 53.4 (H) 06/19/2021   MCV 105 (H) 06/19/2021   PLT 209 06/19/2021   Lab Results  Component Value Date   NA 138 06/19/2021   K 4.4 06/19/2021   CO2 22 06/19/2021   GLUCOSE 148 (H) 06/19/2021   BUN 11 06/19/2021   CREATININE 0.89 06/19/2021   BILITOT 1.6 (H) 06/19/2021   ALKPHOS 108 06/19/2021   AST 24 06/19/2021   ALT 15 06/19/2021   PROT 6.4 06/19/2021   ALBUMIN 3.8 06/19/2021   CALCIUM 9.5 06/19/2021   EGFR 91 06/19/2021   Lab Results  Component Value Date   CHOL 129 06/19/2021   Lab Results  Component Value Date   HDL 29 (L) 06/19/2021   Lab Results  Component Value Date   LDLCALC 80 06/19/2021   Lab Results  Component Value Date   TRIG 109 06/19/2021   Lab Results  Component Value Date   CHOLHDL 4.4 06/19/2021   Lab  Results  Component Value Date   HGBA1C 6.5 (H) 06/19/2021      Assessment & Plan:   Problem List Items Addressed This Visit       Cardiovascular and Mediastinum   Hypertension associated with diabetes ([place]) - Primary    -BP elevated likely secondary to not  taking antihypertensive medications for today yet. Patient reports will take medications as soon as he gets home. BP repeated, improved and stable. -Recommend to monitor BP/pulse at home few times per week and keep a log to bring to next OV. -Discussed recent CMP: renal function and electrolytes normal. -Will continue to monitor.        Endocrine   Hyperlipidemia associated with type 2 diabetes mellitus ([place])    -Discussed recent lipid panel: total cholesterol 129, triglycerides 109, HDL 29, LDL 80 (goal <70). -Continue current medication regimen. Discussed with patient increasing walks to 20-30 minutes. -Follow low fat diet. -Will continue to monitor.      Diabetes mellitus ([place])    -Recent A1c 6.5, mildly increased from 6.3 but stable. -Continue current medication regimen. -Discussed low carbohydrate and glucose diet. Recommend to increase physical activity.  -Discussed scheduling eye exam. -Will continue to monitor.      Abnormal CBC: -Discussed with patient recent labs including CBC. Hemoglobin and hematocrit elevated, previously normal. Recommend repeating CBC at follow up visit. If H/H remain elevated recommend referral to hematology. Patient verbalized understanding.  No orders of the defined types were placed in this encounter.   Follow-up: Return in about 4 months (around 11/12/2021) for DM, HTN, HLD and FBW (lipid panel, cmp, a1c, cbc w/d).    Lorrene Reid, PA-C

## 2021-07-12 NOTE — Assessment & Plan Note (Signed)
-  Recent A1c 6.5, mildly increased from 6.3 but stable. -Continue current medication regimen. -Discussed low carbohydrate and glucose diet. Recommend to increase physical activity.  -Discussed scheduling eye exam. -Will continue to monitor.

## 2021-07-12 NOTE — Patient Instructions (Signed)

## 2021-07-12 NOTE — Assessment & Plan Note (Addendum)
-  BP elevated likely secondary to not taking antihypertensive medications for today yet. Patient reports will take medications as soon as he gets home. BP repeated, improved and stable. -Recommend to monitor BP/pulse at home few times per week and keep a log to bring to next OV. -Discussed recent CMP: renal function and electrolytes normal. -Will continue to monitor.

## 2021-07-12 NOTE — Assessment & Plan Note (Signed)
-  Discussed recent lipid panel: total cholesterol 129, triglycerides 109, HDL 29, LDL 80 (goal <70). -Continue current medication regimen. Discussed with patient increasing walks to 20-30 minutes. -Follow low fat diet. -Will continue to monitor.

## 2021-07-14 ENCOUNTER — Other Ambulatory Visit: Payer: Self-pay | Admitting: Physician Assistant

## 2021-07-14 DIAGNOSIS — I10 Essential (primary) hypertension: Secondary | ICD-10-CM

## 2021-07-20 ENCOUNTER — Other Ambulatory Visit: Payer: Self-pay

## 2021-07-20 DIAGNOSIS — E1169 Type 2 diabetes mellitus with other specified complication: Secondary | ICD-10-CM

## 2021-07-20 MED ORDER — METFORMIN HCL 500 MG PO TABS: ORAL_TABLET | ORAL | 0 refills | Status: DC

## 2021-09-11 ENCOUNTER — Other Ambulatory Visit: Payer: Self-pay | Admitting: Physician Assistant

## 2021-09-11 DIAGNOSIS — I10 Essential (primary) hypertension: Secondary | ICD-10-CM

## 2021-10-15 ENCOUNTER — Other Ambulatory Visit: Payer: Self-pay | Admitting: Physician Assistant

## 2021-10-15 DIAGNOSIS — E1169 Type 2 diabetes mellitus with other specified complication: Secondary | ICD-10-CM

## 2022-01-12 ENCOUNTER — Other Ambulatory Visit: Payer: Self-pay | Admitting: Physician Assistant

## 2022-01-12 DIAGNOSIS — E1169 Type 2 diabetes mellitus with other specified complication: Secondary | ICD-10-CM

## 2022-01-20 ENCOUNTER — Other Ambulatory Visit: Payer: Self-pay | Admitting: Physician Assistant

## 2022-01-20 DIAGNOSIS — I10 Essential (primary) hypertension: Secondary | ICD-10-CM

## 2022-01-25 ENCOUNTER — Other Ambulatory Visit: Payer: Self-pay | Admitting: Physician Assistant

## 2022-01-25 DIAGNOSIS — E1169 Type 2 diabetes mellitus with other specified complication: Secondary | ICD-10-CM

## 2022-01-25 DIAGNOSIS — I10 Essential (primary) hypertension: Secondary | ICD-10-CM

## 2022-06-01 ENCOUNTER — Other Ambulatory Visit: Payer: Self-pay | Admitting: Physician Assistant

## 2022-06-01 DIAGNOSIS — I152 Hypertension secondary to endocrine disorders: Secondary | ICD-10-CM

## 2022-06-15 ENCOUNTER — Other Ambulatory Visit: Payer: Self-pay | Admitting: Physician Assistant

## 2022-06-15 DIAGNOSIS — I152 Hypertension secondary to endocrine disorders: Secondary | ICD-10-CM

## 2022-07-30 ENCOUNTER — Encounter: Payer: Self-pay | Admitting: *Deleted

## 2022-07-30 NOTE — Progress Notes (Signed)
[facility] Quality Team Note  Name: MALIEK SCHELLHORN Date of Birth: 18-Nov-1945 MRN: [id number] Date: 07/30/2022  [facility] Quality Team has reviewed this patient's chart, please see recommendations below:  [facility] Quality Other; (Pt has open gaps for diabetic retinopathy screening and A1C.  Would need to complete before end of 2023 to close gap.)

## 2022-10-24 ENCOUNTER — Other Ambulatory Visit: Payer: Self-pay

## 2022-10-24 DIAGNOSIS — E1169 Type 2 diabetes mellitus with other specified complication: Secondary | ICD-10-CM

## 2022-10-24 MED ORDER — METFORMIN HCL 500 MG PO TABS: ORAL_TABLET | ORAL | 0 refills | Status: DC

## 2022-10-29 ENCOUNTER — Other Ambulatory Visit: Payer: Self-pay

## 2022-10-29 DIAGNOSIS — E1169 Type 2 diabetes mellitus with other specified complication: Secondary | ICD-10-CM

## 2022-10-29 MED ORDER — METFORMIN HCL 500 MG PO TABS: ORAL_TABLET | ORAL | 0 refills | Status: DC

## 2022-11-06 ENCOUNTER — Ambulatory Visit: Payer: Medicare Other | Admitting: Physician Assistant

## 2022-11-07 ENCOUNTER — Other Ambulatory Visit: Payer: Self-pay | Admitting: Nurse Practitioner

## 2022-11-07 DIAGNOSIS — E1169 Type 2 diabetes mellitus with other specified complication: Secondary | ICD-10-CM

## 2022-11-28 ENCOUNTER — Ambulatory Visit: Payer: Medicare Other | Admitting: Physician Assistant

## 2022-11-30 ENCOUNTER — Other Ambulatory Visit: Payer: Self-pay | Admitting: Nurse Practitioner

## 2022-11-30 DIAGNOSIS — E1169 Type 2 diabetes mellitus with other specified complication: Secondary | ICD-10-CM

## 2022-12-25 ENCOUNTER — Ambulatory Visit (INDEPENDENT_AMBULATORY_CARE_PROVIDER_SITE_OTHER): Payer: No Typology Code available for payment source | Admitting: Family Medicine

## 2022-12-25 ENCOUNTER — Encounter: Payer: Self-pay | Admitting: Family Medicine

## 2022-12-25 VITALS — BP 174/80 | HR 75 | Resp 18 | Ht 70.0 in | Wt 169.0 lb

## 2022-12-25 DIAGNOSIS — I152 Hypertension secondary to endocrine disorders: Secondary | ICD-10-CM | POA: Diagnosis not present

## 2022-12-25 DIAGNOSIS — E1169 Type 2 diabetes mellitus with other specified complication: Secondary | ICD-10-CM

## 2022-12-25 DIAGNOSIS — E1159 Type 2 diabetes mellitus with other circulatory complications: Secondary | ICD-10-CM | POA: Diagnosis not present

## 2022-12-25 DIAGNOSIS — N1832 Chronic kidney disease, stage 3b: Secondary | ICD-10-CM

## 2022-12-25 DIAGNOSIS — R490 Dysphonia: Secondary | ICD-10-CM

## 2022-12-25 DIAGNOSIS — Z122 Encounter for screening for malignant neoplasm of respiratory organs: Secondary | ICD-10-CM

## 2022-12-25 DIAGNOSIS — E785 Hyperlipidemia, unspecified: Secondary | ICD-10-CM

## 2022-12-25 DIAGNOSIS — Z1159 Encounter for screening for other viral diseases: Secondary | ICD-10-CM

## 2022-12-25 NOTE — Addendum Note (Signed)
Addended by: Gemma Payor on: 12/25/2022 09:15 AM   Modules accepted: Orders

## 2022-12-25 NOTE — Assessment & Plan Note (Signed)
Patient due for eye exam, foot exam, A1c, urine microalbumin.  Repeating labs today.  Will discuss eye exam and do foot exam at follow-up appointment in about 1 month.

## 2022-12-25 NOTE — Progress Notes (Signed)
Established Patient Office Visit  Subjective   Patient ID: Jason [name], male    DOB: 02/12/1946  Age: 77 y.o. MRN: [id number]  Chief Complaint  Patient presents with   Medical Management of Chronic Issues    Fasting   Diabetes   Hyperlipidemia   Hypertension    HPI Jason [name] is a 77 y.o. male presenting today for list of concerns brought up by his son.  He has a history of hypertension, hyperlipidemia, type 2 diabetes and has not been seen in the office since 2022.  His son wrote a list for him to bring to talk about today.  First he is noticed changes in his voice.  There has been a gradually worsening hoarseness over the past year.  Denies pain or difficulty swallowing, denies history of reflux, denies similar issues in the past.  He does have a long history of smoking cigars, which is another concern of his son's today.  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  Patient states that now if he does have an drink, it is 1 drink a couple of times a week.  He states that he does not believe that he has an issue with it nor depends on it at this point.  Lastly, his hands shake when he misses a meal.  Eating something resolves the issue.  ROS Negative unless otherwise noted in HPI   Objective:     BP (!) 174/80 (BP Location: Right Arm, Patient Position: Sitting, Cuff Size: Normal)   Pulse 75   Resp 18   Ht 5\' 10"  (1.778 m)   Wt 169 lb (76.7 kg)   SpO2 99%   BMI 24.25 kg/m   Physical Exam Constitutional:      General: He is not in acute distress.    Appearance: Normal appearance.  HENT:     Head: Normocephalic and atraumatic.  Cardiovascular:     Rate and Rhythm: Normal rate and regular rhythm.     Heart sounds: Normal heart sounds. No murmur heard.    No friction rub. No gallop.  Pulmonary:     Effort: Pulmonary effort is normal.     Breath sounds: Normal breath sounds.  Skin:    General: Skin is warm  and dry.  Neurological:     Mental Status: He is alert and oriented to person, place, and time.  Psychiatric:        Mood and Affect: Mood normal.     Assessment & Plan:  Hypertension associated with diabetes ([place]) Assessment & Plan: Blood pressure elevated on presentation, patient needs to take blood pressure medicine consistently.  Patient has not been seen for follow-up in quite some time.  Recommended ambulatory blood pressure monitoring, repeating CMP today.  Scheduled follow-up in a few weeks for chronic issues as today was dedicated to new complaints.  Orders: -     CBC with Differential/Platelet; Future -     Comprehensive metabolic panel; Future  Hyperlipidemia associated with type 2 diabetes mellitus ([place]) Assessment & Plan: Last lipid panel was in 2022.  Repeating lipid panel today.  Will adjust medication as indicated by results.  Orders: -     Lipid panel; Future  Type 2 diabetes mellitus with other specified complication, without long-term current use of insulin ([place]) Assessment & Plan: Patient due for eye exam, foot exam, A1c, urine microalbumin.  Repeating labs today.  Will discuss eye exam and do foot  exam at follow-up appointment in about 1 month.  Orders: -     Hemoglobin A1c; Future  Screening for viral disease -     Hepatitis C antibody; Future  Screening for lung cancer -     CT CHEST LUNG CANCER SCREENING LOW DOSE WO CONTRAST; Future  Hoarseness -     TSH; Future -     Ambulatory referral to ENT  Discussed referral to ENT to evaluate hoarseness, patient in agreement.  We also discussed that this may be a change that is normal due to aging, patient verbalized understanding.  Return in about 4 weeks (around 01/22/2023) for follow-up for hypertension, hyperlipidemia, diabetes.   I spent 40 minutes on the day of the encounter to include pre-visit record review, face-to-face time with the patient and post visit ordering of test.  Velva Harman, PA

## 2022-12-25 NOTE — Assessment & Plan Note (Signed)
Blood pressure elevated on presentation, patient needs to take blood pressure medicine consistently.  Patient has not been seen for follow-up in quite some time.  Recommended ambulatory blood pressure monitoring, repeating CMP today.  Scheduled follow-up in a few weeks for chronic issues as today was dedicated to new complaints.

## 2022-12-25 NOTE — Assessment & Plan Note (Signed)
Last lipid panel was in 2022.  Repeating lipid panel today.  Will adjust medication as indicated by results.

## 2022-12-25 NOTE — Patient Instructions (Signed)
Continue limiting your alcohol use. Make an effort to decrease your cigar use to protect your lungs.  We will also get a screening CT scan of the lungs to look for any changes in the tissues because of your history of smoking cigars. I sent a referral to see ENT in [place] to investigate the changes in your voice.  We will also test thyroid levels today.

## 2022-12-26 LAB — MICROALBUMIN / CREATININE URINE RATIO
Creatinine, Urine: 112.2 mg/dL
Microalb/Creat Ratio: 19 mg/g creat (ref 0–29)
Microalbumin, Urine: 21.3 ug/mL

## 2023-01-02 LAB — TSH: TSH: 1.77 u[IU]/mL (ref 0.450–4.500)

## 2023-01-02 LAB — COMPREHENSIVE METABOLIC PANEL
ALT: 18 IU/L (ref 0–44)
AST: 21 IU/L (ref 0–40)
Albumin/Globulin Ratio: 1.9 (ref 1.2–2.2)
Albumin: 4.1 g/dL (ref 3.8–4.8)
Alkaline Phosphatase: 71 IU/L (ref 44–121)
BUN/Creatinine Ratio: 12 (ref 10–24)
BUN: 12 mg/dL (ref 8–27)
Bilirubin Total: 1.1 mg/dL (ref 0.0–1.2)
CO2: 24 mmol/L (ref 20–29)
Calcium: 9.3 mg/dL (ref 8.6–10.2)
Chloride: 97 mmol/L (ref 96–106)
Creatinine, Ser: 0.99 mg/dL (ref 0.76–1.27)
Globulin, Total: 2.2 g/dL (ref 1.5–4.5)
Glucose: 155 mg/dL — ABNORMAL HIGH (ref 70–99)
Potassium: 3.8 mmol/L (ref 3.5–5.2)
Sodium: 137 mmol/L (ref 134–144)
Total Protein: 6.3 g/dL (ref 6.0–8.5)
eGFR: 80 mL/min/{1.73_m2} (ref >59)

## 2023-01-02 LAB — CBC WITH DIFFERENTIAL/PLATELET
Basophils Absolute: 0 10*3/uL (ref 0.0–0.2)
Basos: 0 %
EOS (ABSOLUTE): 0.1 10*3/uL (ref 0.0–0.4)
Eos: 1 %
Hematocrit: 42.3 % (ref 37.5–51.0)
Hemoglobin: 15.1 g/dL (ref 13.0–17.7)
Immature Grans (Abs): 0 10*3/uL (ref 0.0–0.1)
Immature Granulocytes: 0 %
Lymphocytes Absolute: 1.1 10*3/uL (ref 0.7–3.1)
Lymphs: 21 %
MCH: 37.8 pg — ABNORMAL HIGH (ref 26.6–33.0)
MCHC: 35.7 g/dL (ref 31.5–35.7)
MCV: 106 fL — ABNORMAL HIGH (ref 79–97)
Monocytes Absolute: 0.6 10*3/uL (ref 0.1–0.9)
Monocytes: 11 %
Neutrophils Absolute: 3.5 10*3/uL (ref 1.4–7.0)
Neutrophils: 67 %
Platelets: 178 10*3/uL (ref 150–450)
RBC: 4 x10E6/uL — ABNORMAL LOW (ref 4.14–5.80)
RDW: 12.7 % (ref 11.6–15.4)
WBC: 5.3 10*3/uL (ref 3.4–10.8)

## 2023-01-02 LAB — HEMOGLOBIN A1C
Est. average glucose Bld gHb Est-mCnc: 143 mg/dL
Hgb A1c MFr Bld: 6.6 % — ABNORMAL HIGH (ref 4.8–5.6)

## 2023-01-02 LAB — LIPID PANEL
Chol/HDL Ratio: 1.7 ratio (ref 0.0–5.0)
Cholesterol, Total: 156 mg/dL (ref 100–199)
HDL: 90 mg/dL (ref >39)
LDL Chol Calc (NIH): 54 mg/dL (ref 0–99)
Triglycerides: 57 mg/dL (ref 0–149)
VLDL Cholesterol Cal: 12 mg/dL (ref 5–40)

## 2023-01-02 LAB — HEPATITIS C ANTIBODY: Hep C Virus Ab: NONREACTIVE

## 2023-01-22 ENCOUNTER — Ambulatory Visit: Payer: No Typology Code available for payment source | Admitting: Family Medicine

## 2023-01-22 NOTE — Progress Notes (Deleted)
   Established Patient Office Visit  Subjective   Patient ID: Jason [name], male    DOB: 04-21-1946  Age: 77 y.o. MRN: [id number]  No chief complaint on file.   HPI Jason [name] is a 77 y.o. male presenting today for follow up of hypertension, hyperlipidemia, diabetes. Hypertension: Patient here for follow-up of elevated blood pressure. He {is/is not:9024} exercising and {is/is not:9024} adherent to low salt diet.   Pt denies chest pain, SOB, dizziness, edema, syncope, fatigue or heart palpitations. Taking ***, reports {excellent/good/fair/poor:19665} compliance with treatment. Denies side effects. Hyperlipidemia: tolerating *** well with no myalgias or significant side effects. Currently consuming a {diet types:17450} diet. {types:19826} The 10-year ASCVD risk score (Arnett DK, et al., 2019) is: 57.3% Diabetes: denies hypoglycemic events, wounds or sores that are not healing well, increased thirst or urination. Denies vision problems, eye exam ***. Checking glucose at home, ranges have been ***. Taking *** as prescribed without any side effects. Has been following *** diet and *** for exercise.   ROS Negative unless otherwise noted in HPI   Objective:     There were no vitals taken for this visit.  Physical Exam   No results found for any visits on 01/22/23.  {Labs (Optional):23779}  The 10-year ASCVD risk score (Arnett DK, et al., 2019) is: 57.3%    Assessment & Plan:  There are no diagnoses linked to this encounter.  No follow-ups on file.    Melida Quitter, PA

## 2023-01-23 ENCOUNTER — Encounter: Payer: Self-pay | Admitting: Family Medicine

## 2023-01-23 ENCOUNTER — Telehealth: Payer: Self-pay

## 2023-01-23 ENCOUNTER — Ambulatory Visit: Payer: No Typology Code available for payment source | Admitting: Family Medicine

## 2023-01-23 DIAGNOSIS — F1090 Alcohol use, unspecified, uncomplicated: Secondary | ICD-10-CM | POA: Insufficient documentation

## 2023-01-23 NOTE — Telephone Encounter (Signed)
Pt son called back stated that he wasn't able to get touch with his dad and that he lives in [address].   [place] will call back with a plan after speaking to the pt.   FYI

## 2023-01-23 NOTE — Progress Notes (Deleted)
   Established Patient Office Visit  Subjective   Patient ID: Jason [name], male    DOB: July 17, 1946  Age: 77 y.o. MRN: [id number]  No chief complaint on file.   HPI Jason [name] is a 77 y.o. male presenting today for follow up of ***.  ROS Negative unless otherwise noted in HPI   Objective:     There were no vitals taken for this visit.  Physical Exam   No results found for any visits on 01/23/23.  {Labs (Optional):23779}  The 10-year ASCVD risk score (Arnett DK, et al., 2019) is: 57.3%    Assessment & Plan:  There are no diagnoses linked to this encounter.  No follow-ups on file.    Melida Quitter, PA

## 2023-01-23 NOTE — Progress Notes (Deleted)
   Established Patient Office Visit  Subjective   Patient ID: Jason [name], male    DOB: 1946-09-20  Age: 77 y.o. MRN: [id number]  No chief complaint on file.   HPI Jason [name] is a 77 y.o. male presenting today for follow up of hypertension, hyperlipidemia, diabetes. Hypertension: Patient here for follow-up of elevated blood pressure. He {is/is not:9024} exercising and {is/is not:9024} adherent to low salt diet.   Pt denies chest pain, SOB, dizziness, edema, syncope, fatigue or heart palpitations. Taking hydrochlorothiazide and lisinopril, reports {excellent/good/fair/poor:19665} compliance with treatment. Denies side effects. Hyperlipidemia: tolerating atorvastatin well with no myalgias or significant side effects. Currently consuming a {diet types:17450} diet. {types:19826} The 10-year ASCVD risk score (Arnett DK, et al., 2019) is: 57.3% Diabetes: denies hypoglycemic events, wounds or sores that are not healing well, increased thirst or urination. Denies vision problems, eye exam overdue. Checking glucose at home, ranges have been ***. Taking metformin as prescribed without any side effects. Has been following *** diet and *** for exercise.  ROS Negative unless otherwise noted in HPI   Objective:     There were no vitals taken for this visit.  Physical Exam   No results found for any visits on 01/23/23.   Assessment & Plan:  There are no diagnoses linked to this encounter.  No follow-ups on file.    Melida Quitter, PA

## 2023-01-23 NOTE — Telephone Encounter (Addendum)
I reached out to pt to remind him of his appointment yesterday 01/22/23 around 8:50 am to see if he was coming and if so if he could be here within 10 mins to adhere to the late policy. Pt states he forgot but was on the way and that he could be here in 10 mins. When Pt didn't come I called and he said that he forgot and we rescheduled it for today 01/23/23 at 10:50 am. I reached out to the pt at 10:49 am to see if he was headed to his appt he said he forgot. I asked if he could be here in 10 mins he said yes he is just in [employer]. When he didn't show up I called him again at 11:19 am to see if we could reschedule he agree to reschedule saying that he forgot about his appointment. I then moved the appt to 1:30 pm today after he agreed I advised him that I would call him immediately when I came back from lunch to remind him of his appt.  I called at 1:06 pm and reminded him of his appt and he said he forgot but he would be on the way. I then reached out to his son [place] at 1:07 pm to let him know of the concern with the forgetfulness and he was going to reach out to the pt to ensure that he got to the appt. [place] asked if the pt sounded intoxicated and I let him know I didn't think so but I don't know him well enough to know that over the phone.  I called pt again twice at 1:37 pm after he missed his appt at this time the Voicemail picked up.   I informed the provider of the multiple attempts.   I called [place] back 2:10 pm and left a VM to see if we could reschedule to a time that  he could be present.   FYI

## 2023-01-29 ENCOUNTER — Telehealth: Payer: Self-pay

## 2023-01-29 NOTE — Telephone Encounter (Signed)
Called patient to schedule Medicare Annual Wellness Visit (AWV). Left message for patient to call back and schedule Medicare Annual Wellness Visit (AWV).  Last date of AWV: 12/08/20  Please schedule an appointment at any time On Annual Wellness Visit Schedule.     Thank you ,  Agnes Lawrence, CMA (AAMA)  [facility]- AWV Program [phone]

## 2023-04-11 ENCOUNTER — Encounter: Payer: Self-pay | Admitting: Gastroenterology

## 2023-05-13 ENCOUNTER — Telehealth: Payer: Self-pay

## 2023-05-13 DIAGNOSIS — I152 Hypertension secondary to endocrine disorders: Secondary | ICD-10-CM

## 2023-05-13 DIAGNOSIS — I10 Essential (primary) hypertension: Secondary | ICD-10-CM

## 2023-05-13 MED ORDER — HYDROCHLOROTHIAZIDE 12.5 MG PO CAPS: ORAL_CAPSULE | ORAL | 0 refills | Status: DC

## 2023-05-13 MED ORDER — ATORVASTATIN CALCIUM 80 MG PO TABS: ORAL_TABLET | ORAL | 0 refills | Status: DC

## 2023-05-13 NOTE — Telephone Encounter (Signed)
Refills sent

## 2023-05-13 NOTE — Telephone Encounter (Signed)
Prescription Request  05/13/2023  LOV: 12/25/22  What is the name of the medication or equipment?   atorvastatin (LIPITOR) 80 MG tablet    hydrochlorothiazide (MICROZIDE) 12.5 MG capsule     Have you contacted your pharmacy to request a refill? Yes   Which pharmacy would you like this sent to?  [id number] [place], [place] - [address] [place] [address] Phone: [phone] Fax: [phone]  Patient notified that their request is being sent to the clinical staff for review and that they should receive a response within 2 business days.   Please advise at Mobile [phone] (mobile)

## 2023-05-29 ENCOUNTER — Ambulatory Visit (INDEPENDENT_AMBULATORY_CARE_PROVIDER_SITE_OTHER): Payer: No Typology Code available for payment source | Admitting: Family Medicine

## 2023-05-29 ENCOUNTER — Encounter: Payer: Self-pay | Admitting: Family Medicine

## 2023-05-29 VITALS — BP 150/79 | HR 89 | Resp 18 | Ht 70.0 in | Wt 149.0 lb

## 2023-05-29 DIAGNOSIS — E785 Hyperlipidemia, unspecified: Secondary | ICD-10-CM | POA: Diagnosis not present

## 2023-05-29 DIAGNOSIS — E1159 Type 2 diabetes mellitus with other circulatory complications: Secondary | ICD-10-CM

## 2023-05-29 DIAGNOSIS — E1169 Type 2 diabetes mellitus with other specified complication: Secondary | ICD-10-CM | POA: Diagnosis not present

## 2023-05-29 DIAGNOSIS — I152 Hypertension secondary to endocrine disorders: Secondary | ICD-10-CM

## 2023-05-29 DIAGNOSIS — Z7984 Long term (current) use of oral hypoglycemic drugs: Secondary | ICD-10-CM

## 2023-05-29 LAB — POCT GLYCOSYLATED HEMOGLOBIN (HGB A1C): HbA1c POC (<> result, manual entry): 7.3 % (ref 4.0–5.6)

## 2023-05-29 MED ORDER — HYDROCHLOROTHIAZIDE 12.5 MG PO CAPS: ORAL_CAPSULE | ORAL | 0 refills | Status: DC

## 2023-05-29 MED ORDER — LISINOPRIL 30 MG PO TABS: ORAL_TABLET | ORAL | 0 refills | Status: DC

## 2023-05-29 MED ORDER — METFORMIN HCL 500 MG PO TABS: 500.0000 mg | ORAL_TABLET | Freq: Every day | ORAL | 0 refills | Status: DC

## 2023-05-29 NOTE — Assessment & Plan Note (Signed)
Last lipid panel: LDL 54, HDL 90, triglycerides 57.  Continue atorvastatin 80 mg daily.  Will continue to monitor.

## 2023-05-29 NOTE — Progress Notes (Signed)
Established Patient Office Visit  Subjective   Patient ID: Jason [name], male    DOB: 1946/09/24  Age: 77 y.o. MRN: [id number]  Chief Complaint  Patient presents with   Diabetes   Hypertension    HPI Jason [name] is a 77 y.o. male presenting today for follow up of hypertension, hyperlipidemia, diabetes. Hypertension:  Pt denies chest pain, SOB, dizziness, edema, syncope, fatigue or heart palpitations. Taking hydrochlorothiazide, reports excellent compliance with treatment. Denies side effects. Hyperlipidemia: tolerating atorvastatin well with no myalgias or significant side effects. Currently consuming a low fat diet.  The 10-year ASCVD risk score (Arnett DK, et al., 2019) is: 54.7% Diabetes: denies hypoglycemic events, wounds or sores that are not healing well, increased thirst or urination. Denies vision problems, eye exam due. Taking metformin 250 mg once daily as prescribed without any side effects.   ROS Negative unless otherwise noted in HPI   Objective:     BP (!) 178/106 (BP Location: Left Arm, Patient Position: Sitting, Cuff Size: Normal)   Pulse 89   Resp 18   Ht 5\' 10"  (1.778 m)   Wt 149 lb (67.6 kg)   SpO2 97%   BMI 21.38 kg/m   Physical Exam Constitutional:      General: He is not in acute distress.    Appearance: Normal appearance.  HENT:     Head: Normocephalic and atraumatic.  Cardiovascular:     Rate and Rhythm: Normal rate and regular rhythm.     Heart sounds: Normal heart sounds. No murmur heard.    No friction rub. No gallop.  Pulmonary:     Effort: Pulmonary effort is normal. No respiratory distress.     Breath sounds: Normal breath sounds. No wheezing, rhonchi or rales.  Skin:    General: Skin is warm and dry.  Neurological:     Mental Status: He is alert and oriented to person, place, and time.  Psychiatric:        Mood and Affect: Mood normal.    Diabetic Foot Exam - Simple   Simple Foot Form Diabetic Foot exam was  performed with the following findings: Yes 05/29/2023  2:56 PM  Visual Inspection No deformities, no ulcerations, no other skin breakdown bilaterally: Yes Sensation Testing Intact to touch and monofilament testing bilaterally: Yes Pulse Check Posterior Tibialis and Dorsalis pulse intact bilaterally: Yes Comments    Results for orders placed or performed in visit on 05/29/23  POCT HgB A1C  Result Value Ref Range   Hemoglobin A1C     HbA1c POC (<> result, manual entry) 7.3 4.0 - 5.6 %   HbA1c, POC (prediabetic range)     HbA1c, POC (controlled diabetic range)       Assessment & Plan:  Type 2 diabetes mellitus with other specified complication, without long-term current use of insulin (HCC) Assessment & Plan: A1c increased significantly to 7.3.  Increase metformin to 500 mg daily, may also increase in the future if needed.  Foot exam completed today, patient due eye exam.  Will continue to monitor.  Orders: -     POCT glycosylated hemoglobin (Hb A1C) -     CBC with Differential/Platelet; Future -     Comprehensive metabolic panel; Future -     Lipid panel; Future -     metFORMIN HCl; Take 1 tablet (500 mg total) by mouth daily with breakfast.  Dispense: 135 tablet; Refill: 0  Hypertension associated with diabetes (HCC) Assessment & Plan: BP goal <130/80.  Blood pressure above goal in the office, patient has not filled lisinopril prescription since 2023.  Provided refills of both hydrochlorothiazide and lisinopril.  Continue lisinopril 30 mg daily, hydrochlorothiazide 12.5 mg daily.  If blood pressure remains elevated once consistently on 2 medication regimen, will adjust as needed.  Orders: -     CBC with Differential/Platelet; Future -     Comprehensive metabolic panel; Future -     Lisinopril; TAKE 1 TABLET BY MOUTH EVERY DAY  Dispense: 90 tablet; Refill: 0 -     hydroCHLOROthiazide; TAKE 1 CAPSULE BY MOUTH EVERY DAY  Dispense: 90 capsule; Refill: 0  Hyperlipidemia associated  with type 2 diabetes mellitus (HCC) Assessment & Plan: Last lipid panel: LDL 54, HDL 90, triglycerides 57.  Continue atorvastatin 80 mg daily.  Will continue to monitor.  Orders: -     CBC with Differential/Platelet; Future -     Comprehensive metabolic panel; Future -     Lipid panel; Future  Also discussed that he is due for tetanus, shingles, pneumonia vaccines.  Aware that he needs to go to pharmacy to have these updated.  Return in about 3 months (around 08/29/2023) for follow-up for HTN, HLD, DM, fasting blood work 1 week before.    Jason Quitter, PA

## 2023-05-29 NOTE — Assessment & Plan Note (Signed)
A1c increased significantly to 7.3.  Increase metformin to 500 mg daily, may also increase in the future if needed.  Foot exam completed today, patient due eye exam.  Will continue to monitor.

## 2023-05-29 NOTE — Assessment & Plan Note (Signed)
BP goal <130/80.  Blood pressure above goal in the office, patient has not filled lisinopril prescription since 2023.  Provided refills of both hydrochlorothiazide and lisinopril.  Continue lisinopril 30 mg daily, hydrochlorothiazide 12.5 mg daily.  If blood pressure remains elevated once consistently on 2 medication regimen, will adjust as needed.

## 2023-05-29 NOTE — Patient Instructions (Signed)
AT THE PHARMACY: -DUE tetanus booster (Tdap - once every 10 years) -DUE Shingles shot (2 dose series, no boosters!) -DUE pneumonia shot (PCV20 - one and done!)

## 2023-08-19 ENCOUNTER — Other Ambulatory Visit: Payer: Self-pay | Admitting: Family Medicine

## 2023-08-19 DIAGNOSIS — E1159 Type 2 diabetes mellitus with other circulatory complications: Secondary | ICD-10-CM

## 2023-08-21 ENCOUNTER — Other Ambulatory Visit: Payer: Self-pay | Admitting: Family Medicine

## 2023-08-21 DIAGNOSIS — E1169 Type 2 diabetes mellitus with other specified complication: Secondary | ICD-10-CM

## 2023-08-27 ENCOUNTER — Other Ambulatory Visit: Payer: No Typology Code available for payment source

## 2023-09-03 ENCOUNTER — Ambulatory Visit: Payer: No Typology Code available for payment source | Admitting: Family Medicine

## 2023-09-13 ENCOUNTER — Other Ambulatory Visit: Payer: Self-pay | Admitting: Family Medicine

## 2023-09-13 DIAGNOSIS — E1159 Type 2 diabetes mellitus with other circulatory complications: Secondary | ICD-10-CM

## 2023-10-07 ENCOUNTER — Ambulatory Visit (INDEPENDENT_AMBULATORY_CARE_PROVIDER_SITE_OTHER): Payer: No Typology Code available for payment source | Admitting: Family Medicine

## 2023-10-07 ENCOUNTER — Encounter: Payer: Self-pay | Admitting: Family Medicine

## 2023-10-07 VITALS — BP 168/92 | HR 54 | Temp 98.3°F | Ht 70.0 in | Wt 148.2 lb

## 2023-10-07 DIAGNOSIS — R413 Other amnesia: Secondary | ICD-10-CM | POA: Diagnosis not present

## 2023-10-07 DIAGNOSIS — Z7984 Long term (current) use of oral hypoglycemic drugs: Secondary | ICD-10-CM | POA: Diagnosis not present

## 2023-10-07 DIAGNOSIS — E1159 Type 2 diabetes mellitus with other circulatory complications: Secondary | ICD-10-CM | POA: Diagnosis not present

## 2023-10-07 DIAGNOSIS — I152 Hypertension secondary to endocrine disorders: Secondary | ICD-10-CM

## 2023-10-07 DIAGNOSIS — E785 Hyperlipidemia, unspecified: Secondary | ICD-10-CM

## 2023-10-07 DIAGNOSIS — E1169 Type 2 diabetes mellitus with other specified complication: Secondary | ICD-10-CM | POA: Diagnosis not present

## 2023-10-07 MED ORDER — LISINOPRIL 30 MG PO TABS: ORAL_TABLET | ORAL | 0 refills | Status: DC

## 2023-10-07 MED ORDER — METFORMIN HCL 500 MG PO TABS: 500.0000 mg | ORAL_TABLET | Freq: Every day | ORAL | 1 refills | Status: DC

## 2023-10-07 MED ORDER — ATORVASTATIN CALCIUM 80 MG PO TABS: 80.0000 mg | ORAL_TABLET | Freq: Every day | ORAL | 0 refills | Status: DC

## 2023-10-07 MED ORDER — HYDROCHLOROTHIAZIDE 12.5 MG PO CAPS: ORAL_CAPSULE | ORAL | 0 refills | Status: DC

## 2023-10-07 NOTE — Assessment & Plan Note (Signed)
 Last A1c 7.3, rechecking today.  Continue metformin 500 mg daily, may also increase in the future if needed.  Discussed recommendation for annual eye exam to check for retinopathy.  Patient verbalized understanding.  Will continue to monitor.

## 2023-10-07 NOTE — Patient Instructions (Addendum)
 Check your blood pressure twice a day at least 1 hour after taking your blood pressure medicine and write it on the blood pressure log.  Bring this to your appointment in 2 weeks.  PREVENTATIVE CARE: To protect you from getting sick and needing to take extra medication or potentially stay at the hospital, it is recommended to stay up-to-date on the following vaccinations.  You can have all of these at your pharmacy. -Tetanus (Tdap): Once every 10 years -Pneumonia (PCV 20): 1 dose to protect you the rest of your life -Shingles: 2 doses to protect you for the rest of your life

## 2023-10-07 NOTE — Progress Notes (Signed)
 Established Patient Office Visit  Subjective   Patient ID: Jason [name], male    DOB: 1945-11-19  Age: 78 y.o. MRN: [id number]  Chief Complaint  Patient presents with   Diabetes    HPI Jason [name] is a 78 y.o. male presenting today for follow up of hypertension, hyperlipidemia, diabetes.  He was running late to get to his appointment this morning because he forgot that he had one until he received a reminder call from [place] this morning.  This is not the first time that he has forgotten that he has an appointment. Hypertension:  Pt denies chest pain, SOB, dizziness, edema, syncope, fatigue or heart palpitations. Taking lisinopril  and hydrochlorothiazide , reports excellent compliance with treatment. Denies side effects.  He has not been checking his blood pressure routinely at home.  He also notes that he has not taken his blood pressure medicine yet today. Hyperlipidemia: tolerating atorvastatin  well with no myalgias or significant side effects.  The 10-year ASCVD risk score (Arnett DK, et al., 2019) is: 54.3% Diabetes: denies hypoglycemic events, wounds or sores that are not healing well, increased thirst or urination. Denies vision problems, eye exam due. Taking metformin  as prescribed without any side effects.   Outpatient Medications Prior to Visit  Medication Sig   blood glucose meter kit and supplies Please dispense One Touch Ultra Kit for use to check fasting blood sugars once daily   Blood Glucose Monitoring Suppl (ACCU-CHEK AVIVA PLUS) w/Device KIT Use to check blood sugars fasting every morning and 2 hours after largest meal   Blood Pressure KIT Use to check blood pressure twice daily   glucose blood (ACCU-CHEK AVIVA PLUS) test strip Use to check blood sugars fasting every morning and 2 hours after largest meal   Lancets (ACCU-CHEK SOFT TOUCH) lancets Use to check blood sugars fasting every morning and 2 hours after largest meal   Lancets Misc. (ACCU-CHEK  SOFTCLIX LANCET DEV) KIT Use to check blood sugars fasting every morning and 2 hours after largest meal   [DISCONTINUED] atorvastatin  (LIPITOR) 80 MG tablet TAKE 1 TABLET BY MOUTH EVERY DAY   [DISCONTINUED] hydrochlorothiazide  (MICROZIDE ) 12.5 MG capsule TAKE 1 CAPSULE BY MOUTH EVERY DAY   [DISCONTINUED] lisinopril  (ZESTRIL ) 30 MG tablet TAKE 1 TABLET BY MOUTH EVERY DAY   [DISCONTINUED] metFORMIN  (GLUCOPHAGE ) 500 MG tablet TAKE 1 TABLET BY MOUTH EVERY DAY WITH BREAKFAST   No facility-administered medications prior to visit.    ROS Negative unless otherwise noted in HPI   Objective:     BP (!) 168/92   Pulse (!) 54   Temp 98.3 F (36.8 C) (Oral)   Wt 148 lb 4 oz (67.2 kg)   BMI 21.27 kg/m   Physical Exam Constitutional:      General: He is not in acute distress.    Appearance: Normal appearance.  HENT:     Head: Normocephalic and atraumatic.  Cardiovascular:     Rate and Rhythm: Normal rate and regular rhythm.     Heart sounds: Normal heart sounds. No murmur heard.    No friction rub. No gallop.  Pulmonary:     Effort: Pulmonary effort is normal. No respiratory distress.     Breath sounds: Normal breath sounds. No wheezing, rhonchi or rales.  Skin:    General: Skin is warm and dry.  Neurological:     Mental Status: He is alert and oriented to person, place, and time.  Psychiatric:        Mood and Affect:  Mood normal.      Assessment & Plan:  Type 2 diabetes mellitus with other specified complication, without long-term current use of insulin (HCC) Assessment & Plan: Last A1c 7.3, rechecking today.  Continue metformin  500 mg daily, may also increase in the future if needed.  Discussed recommendation for annual eye exam to check for retinopathy.  Patient verbalized understanding.  Will continue to monitor.  Orders: -     Hemoglobin A1c; Future -     Lipid panel -     Comprehensive metabolic panel -     CBC with Differential/Platelet -     metFORMIN  HCl; Take 1  tablet (500 mg total) by mouth daily with breakfast.  Dispense: 90 tablet; Refill: 1  Hyperlipidemia associated with type 2 diabetes mellitus (HCC) Assessment & Plan: Last lipid panel: LDL 54, HDL 90, triglycerides 57.  Rechecking lipid panel and hepatic function today.  Continue atorvastatin  80 mg daily.  Will continue to monitor.  Orders: -     Lipid panel -     Comprehensive metabolic panel -     CBC with Differential/Platelet -     Atorvastatin  Calcium ; Take 1 tablet (80 mg total) by mouth daily.  Dispense: 90 tablet; Refill: 0  Hypertension associated with diabetes (HCC) Assessment & Plan: BP goal <130/80.  Blood pressure above goal in the office, patient has not taken blood pressure medicine today. Continue lisinopril  30 mg daily, hydrochlorothiazide  12.5 mg daily and keep blood pressure log at home for the next 2 weeks.  If blood pressure remains elevated once consistently on 2 medication regimen, will adjust as needed.  Orders: -     Comprehensive metabolic panel -     CBC with Differential/Platelet -     hydroCHLOROthiazide ; TAKE 1 CAPSULE BY MOUTH EVERY DAY  Dispense: 90 capsule; Refill: 0 -     Lisinopril ; TAKE 1 TABLET BY MOUTH EVERY DAY  Dispense: 90 tablet; Refill: 0  Memory loss Assessment & Plan: Patient has forgotten/missed several appointments now.  It does not appear that there has been any previous evaluation of memory loss.  Will discuss this at appointment in April 2025.   Declined influenza, pneumonia, shingles vaccines today.  Provided education and recommendations for influenza, pneumonia, shingles, tetanus vaccines.  Return in about 2 weeks (around 10/21/2023) for BP check nurse visit; 3 months for follow up of HTN, HLD, DM, POC A1c at visit.    Joesph DELENA Sear, PA

## 2023-10-07 NOTE — Assessment & Plan Note (Signed)
 BP goal <130/80.  Blood pressure above goal in the office, patient has not taken blood pressure medicine today. Continue lisinopril  30 mg daily, hydrochlorothiazide  12.5 mg daily and keep blood pressure log at home for the next 2 weeks.  If blood pressure remains elevated once consistently on 2 medication regimen, will adjust as needed.

## 2023-10-07 NOTE — Assessment & Plan Note (Signed)
 Patient has forgotten/missed several appointments now.  It does not appear that there has been any previous evaluation of memory loss.  Will discuss this at appointment in April 2025.

## 2023-10-07 NOTE — Assessment & Plan Note (Signed)
 Last lipid panel: LDL 54, HDL 90, triglycerides 57.  Rechecking lipid panel and hepatic function today.  Continue atorvastatin 80 mg daily.  Will continue to monitor.

## 2023-10-08 ENCOUNTER — Other Ambulatory Visit: Payer: Self-pay | Admitting: Family Medicine

## 2023-10-08 DIAGNOSIS — E1169 Type 2 diabetes mellitus with other specified complication: Secondary | ICD-10-CM

## 2023-10-08 LAB — COMPREHENSIVE METABOLIC PANEL
ALT: 5 [IU]/L (ref 0–44)
AST: 8 [IU]/L (ref 0–40)
Albumin: 3.7 g/dL — ABNORMAL LOW (ref 3.8–4.8)
Alkaline Phosphatase: 86 [IU]/L (ref 44–121)
BUN/Creatinine Ratio: 10 (ref 10–24)
BUN: 8 mg/dL (ref 8–27)
Bilirubin Total: 1.2 mg/dL (ref 0.0–1.2)
CO2: 29 mmol/L (ref 20–29)
Calcium: 9 mg/dL (ref 8.6–10.2)
Chloride: 99 mmol/L (ref 96–106)
Creatinine, Ser: 0.84 mg/dL (ref 0.76–1.27)
Globulin, Total: 2.3 g/dL (ref 1.5–4.5)
Glucose: 187 mg/dL — ABNORMAL HIGH (ref 70–99)
Potassium: 4 mmol/L (ref 3.5–5.2)
Sodium: 138 mmol/L (ref 134–144)
Total Protein: 6 g/dL (ref 6.0–8.5)
eGFR: 91 mL/min/{1.73_m2} (ref >59)

## 2023-10-08 LAB — CBC WITH DIFFERENTIAL/PLATELET
Basophils Absolute: 0 10*3/uL (ref 0.0–0.2)
Basos: 0 %
EOS (ABSOLUTE): 0.1 10*3/uL (ref 0.0–0.4)
Eos: 1 %
Hematocrit: 44.9 % (ref 37.5–51.0)
Hemoglobin: 15.2 g/dL (ref 13.0–17.7)
Immature Grans (Abs): 0 10*3/uL (ref 0.0–0.1)
Immature Granulocytes: 0 %
Lymphocytes Absolute: 1.7 10*3/uL (ref 0.7–3.1)
Lymphs: 25 %
MCH: 35 pg — ABNORMAL HIGH (ref 26.6–33.0)
MCHC: 33.9 g/dL (ref 31.5–35.7)
MCV: 104 fL — ABNORMAL HIGH (ref 79–97)
Monocytes Absolute: 0.6 10*3/uL (ref 0.1–0.9)
Monocytes: 8 %
Neutrophils Absolute: 4.4 10*3/uL (ref 1.4–7.0)
Neutrophils: 66 %
Platelets: 191 10*3/uL (ref 150–450)
RBC: 4.34 x10E6/uL (ref 4.14–5.80)
RDW: 12.1 % (ref 11.6–15.4)
WBC: 6.8 10*3/uL (ref 3.4–10.8)

## 2023-10-08 LAB — HEMOGLOBIN A1C
Est. average glucose Bld gHb Est-mCnc: 206 mg/dL
Hgb A1c MFr Bld: 8.8 % — ABNORMAL HIGH (ref 4.8–5.6)

## 2023-10-08 LAB — LIPID PANEL
Chol/HDL Ratio: 3.9 {ratio} (ref 0.0–5.0)
Cholesterol, Total: 159 mg/dL (ref 100–199)
HDL: 41 mg/dL (ref >39)
LDL Chol Calc (NIH): 99 mg/dL (ref 0–99)
Triglycerides: 100 mg/dL (ref 0–149)
VLDL Cholesterol Cal: 19 mg/dL (ref 5–40)

## 2023-10-08 MED ORDER — METFORMIN HCL 500 MG PO TABS: 1000.0000 mg | ORAL_TABLET | Freq: Every day | ORAL | 1 refills | Status: DC

## 2023-10-21 ENCOUNTER — Ambulatory Visit: Payer: No Typology Code available for payment source

## 2023-10-22 ENCOUNTER — Telehealth: Payer: Self-pay

## 2023-10-22 NOTE — Telephone Encounter (Signed)
Need to rescheduled the Nurse visit/Clinical support for the BP check.

## 2023-11-07 ENCOUNTER — Encounter: Payer: Self-pay | Admitting: Family Medicine

## 2023-11-09 ENCOUNTER — Encounter: Payer: Self-pay | Admitting: Family Medicine

## 2024-01-06 ENCOUNTER — Ambulatory Visit: Payer: No Typology Code available for payment source | Admitting: Family Medicine

## 2024-02-26 ENCOUNTER — Telehealth: Payer: Self-pay | Admitting: *Deleted

## 2024-02-26 NOTE — Telephone Encounter (Signed)
 Copied from CRM [id number]. Topic: Clinical - Medication Question >> Feb 26, 2024 10:14 AM Crispin Dolphin wrote: Reason for CRM: Patient POA called. States patient was patient here. Recently moved to [name]. Changed insurance. New insurance [employer] and starts 6/1. Can not get into to new PCP in new location until August 19th. New PCP office told them to reach out to previous provider to see if patient has enough refills on medication to last until he can be seen and if not could refills be sent. Was not sure of what medications are up for refill. If anything is sent caller would like sent to [facility] Address: [address] [place] [address] [place], [place] [address] Phone: [phone]  Thank You

## 2024-02-27 ENCOUNTER — Other Ambulatory Visit: Payer: Self-pay | Admitting: Family Medicine

## 2024-02-27 DIAGNOSIS — E785 Hyperlipidemia, unspecified: Secondary | ICD-10-CM

## 2024-02-27 DIAGNOSIS — E1169 Type 2 diabetes mellitus with other specified complication: Secondary | ICD-10-CM

## 2024-02-27 DIAGNOSIS — I152 Hypertension secondary to endocrine disorders: Secondary | ICD-10-CM

## 2024-02-27 MED ORDER — LISINOPRIL 30 MG PO TABS: ORAL_TABLET | ORAL | 0 refills | Status: DC

## 2024-02-27 MED ORDER — HYDROCHLOROTHIAZIDE 12.5 MG PO CAPS: ORAL_CAPSULE | ORAL | 0 refills | Status: DC

## 2024-02-27 MED ORDER — ATORVASTATIN CALCIUM 80 MG PO TABS
80.0000 mg | ORAL_TABLET | Freq: Every day | ORAL | 0 refills | Status: DC
Start: 2024-02-27 — End: 2024-06-05

## 2024-02-27 MED ORDER — METFORMIN HCL 500 MG PO TABS: 1000.0000 mg | ORAL_TABLET | Freq: Every day | ORAL | 0 refills | Status: DC

## 2024-02-27 NOTE — Telephone Encounter (Signed)
 I have sent in 90 day refills for the 4 medications Jason [name] has prescribed in the past.

## 2024-02-28 NOTE — Telephone Encounter (Signed)
 Tried to contact pt to inform him of below and call would not go through.

## 2024-06-05 ENCOUNTER — Other Ambulatory Visit: Payer: Self-pay | Admitting: Family Medicine

## 2024-06-05 DIAGNOSIS — E1169 Type 2 diabetes mellitus with other specified complication: Secondary | ICD-10-CM

## 2024-06-05 DIAGNOSIS — E1159 Type 2 diabetes mellitus with other circulatory complications: Secondary | ICD-10-CM
